# Patient Record
Sex: Female | Born: 1955 | Race: Black or African American | Hispanic: No | Marital: Single | Smoking: Former smoker
Health system: Southern US, Community
[De-identification: ages and names within clinical notes are randomized; demographics above are authoritative.]

## PROBLEM LIST (undated history)

## (undated) DIAGNOSIS — R600 Localized edema: Secondary | ICD-10-CM

## (undated) DIAGNOSIS — E039 Hypothyroidism, unspecified: Secondary | ICD-10-CM

## (undated) DIAGNOSIS — F319 Bipolar disorder, unspecified: Secondary | ICD-10-CM

## (undated) DIAGNOSIS — L0390 Cellulitis, unspecified: Secondary | ICD-10-CM

## (undated) DIAGNOSIS — R609 Edema, unspecified: Secondary | ICD-10-CM

## (undated) DIAGNOSIS — I10 Essential (primary) hypertension: Secondary | ICD-10-CM

## (undated) DIAGNOSIS — I606 Nontraumatic subarachnoid hemorrhage from other intracranial arteries: Secondary | ICD-10-CM

## (undated) DIAGNOSIS — E119 Type 2 diabetes mellitus without complications: Secondary | ICD-10-CM

## (undated) DIAGNOSIS — M8580 Other specified disorders of bone density and structure, unspecified site: Secondary | ICD-10-CM

## (undated) HISTORY — DX: Hypothyroidism, unspecified: E03.9

## (undated) HISTORY — DX: Localized edema: R60.0

## (undated) HISTORY — DX: Nontraumatic subarachnoid hemorrhage from other intracranial arteries: I60.6

## (undated) HISTORY — PX: FINGER SURGERY: SHX640

## (undated) HISTORY — DX: Type 2 diabetes mellitus without complications: E11.9

## (undated) HISTORY — DX: Essential (primary) hypertension: I10

## (undated) HISTORY — DX: Edema, unspecified: R60.9

## (undated) HISTORY — DX: Bipolar disorder, unspecified: F31.9

## (undated) HISTORY — DX: Other specified disorders of bone density and structure, unspecified site: M85.80

---

## 1998-12-25 HISTORY — PX: APPENDECTOMY: SHX54

## 1999-11-01 ENCOUNTER — Encounter (INDEPENDENT_AMBULATORY_CARE_PROVIDER_SITE_OTHER): Payer: Self-pay | Admitting: Specialist

## 1999-11-01 ENCOUNTER — Inpatient Hospital Stay (HOSPITAL_COMMUNITY): Admission: EM | Admit: 1999-11-01 | Discharge: 1999-11-05 | Payer: Self-pay | Admitting: Emergency Medicine

## 2001-07-03 ENCOUNTER — Encounter: Admission: RE | Admit: 2001-07-03 | Discharge: 2001-10-01 | Payer: Self-pay | Admitting: Family Medicine

## 2002-05-20 ENCOUNTER — Other Ambulatory Visit: Admission: RE | Admit: 2002-05-20 | Discharge: 2002-05-20 | Payer: Self-pay | Admitting: Family Medicine

## 2003-01-18 ENCOUNTER — Emergency Department (HOSPITAL_COMMUNITY): Admission: EM | Admit: 2003-01-18 | Discharge: 2003-01-18 | Payer: Self-pay | Admitting: Emergency Medicine

## 2003-01-18 ENCOUNTER — Encounter: Payer: Self-pay | Admitting: Emergency Medicine

## 2003-06-10 ENCOUNTER — Other Ambulatory Visit: Admission: RE | Admit: 2003-06-10 | Discharge: 2003-06-10 | Payer: Self-pay | Admitting: Family Medicine

## 2004-06-13 ENCOUNTER — Other Ambulatory Visit: Admission: RE | Admit: 2004-06-13 | Discharge: 2004-06-13 | Payer: Self-pay | Admitting: Family Medicine

## 2005-06-12 ENCOUNTER — Other Ambulatory Visit: Admission: RE | Admit: 2005-06-12 | Discharge: 2005-06-12 | Payer: Self-pay | Admitting: Family Medicine

## 2006-06-06 ENCOUNTER — Other Ambulatory Visit: Admission: RE | Admit: 2006-06-06 | Discharge: 2006-06-06 | Payer: Self-pay | Admitting: Family Medicine

## 2006-12-25 HISTORY — PX: BRAIN SURGERY: SHX531

## 2007-12-09 ENCOUNTER — Other Ambulatory Visit: Admission: RE | Admit: 2007-12-09 | Discharge: 2007-12-09 | Payer: Self-pay | Admitting: Family Medicine

## 2007-12-13 ENCOUNTER — Encounter: Payer: Self-pay | Admitting: Emergency Medicine

## 2007-12-14 ENCOUNTER — Ambulatory Visit: Payer: Self-pay | Admitting: Pulmonary Disease

## 2007-12-14 ENCOUNTER — Inpatient Hospital Stay (HOSPITAL_COMMUNITY): Admission: EM | Admit: 2007-12-14 | Discharge: 2008-01-03 | Payer: Self-pay | Admitting: Emergency Medicine

## 2007-12-27 ENCOUNTER — Ambulatory Visit: Payer: Self-pay | Admitting: Physical Medicine & Rehabilitation

## 2008-01-24 ENCOUNTER — Encounter: Admission: RE | Admit: 2008-01-24 | Discharge: 2008-04-23 | Payer: Self-pay | Admitting: Neurosurgery

## 2008-02-05 ENCOUNTER — Encounter: Admission: RE | Admit: 2008-02-05 | Discharge: 2008-02-05 | Payer: Self-pay | Admitting: Neurosurgery

## 2008-09-14 IMAGING — CT CT HEAD W/O CM
1 series · 15 of 30 positions shown, 19 images · IV contrast (agent unspecified)
Comparison: 12/24/07 and earlier.

CLINICAL DATA: 51-year-old female with history of PICA aneurysm rupture and craniotomy with aneurysm clipping.  
 HEAD CT WITHOUT CONTRAST:
TECHNIQUE: Contiguous axial images were obtained from the base of the skull through the vertex according to standard protocol without contrast.

[Series 2: brain · axial · 0.47mm/px · z∈[+109,+252]mm · 15 of 32 slices shown, 19 images]
[im 2/32  brain]
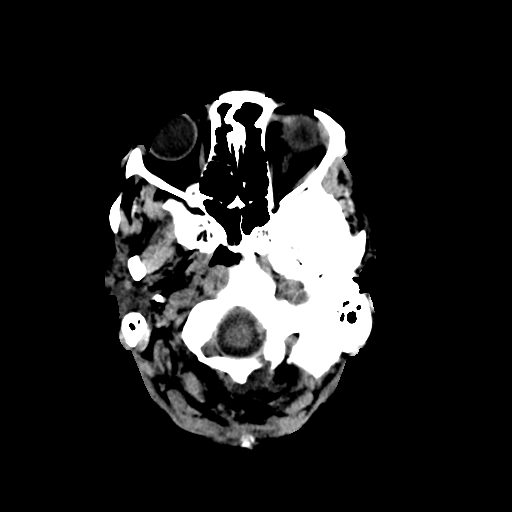
[im 2/32  bone]
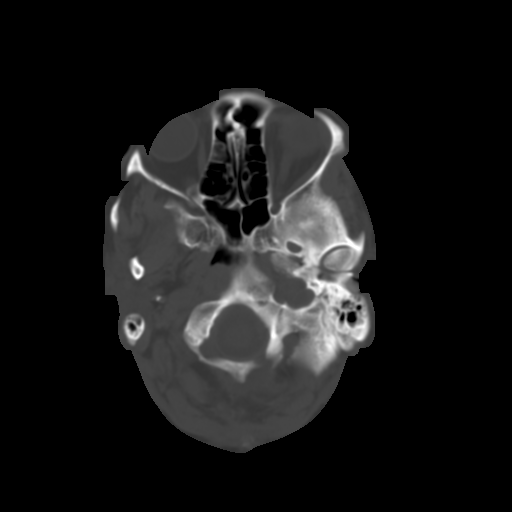
[im 4/32  brain]
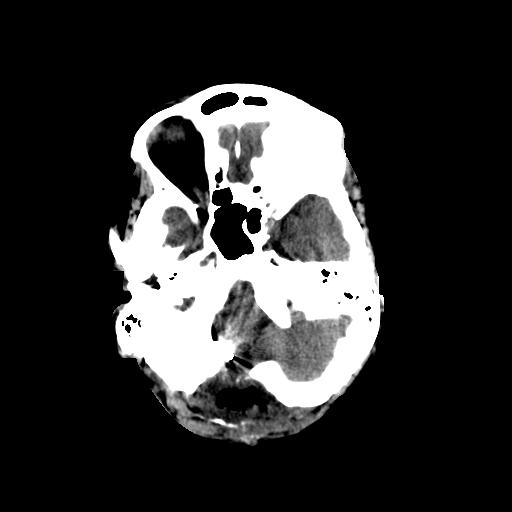
[im 6/32  brain]
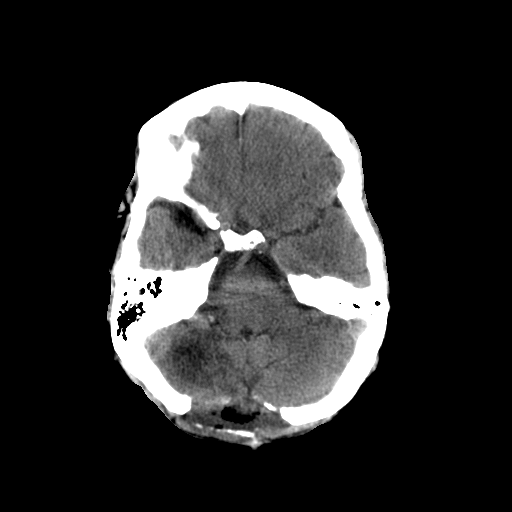
[im 8/32  brain]
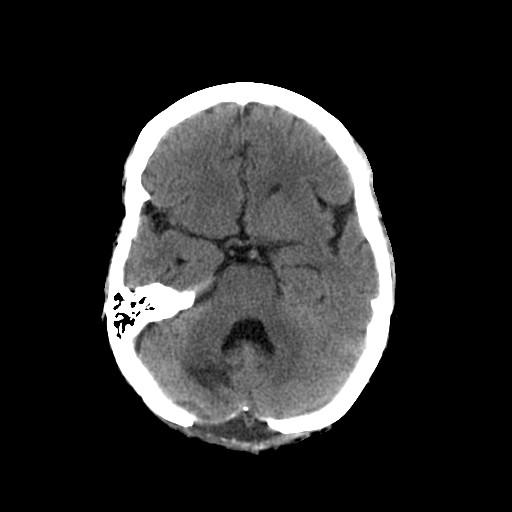
[im 10/32  brain]
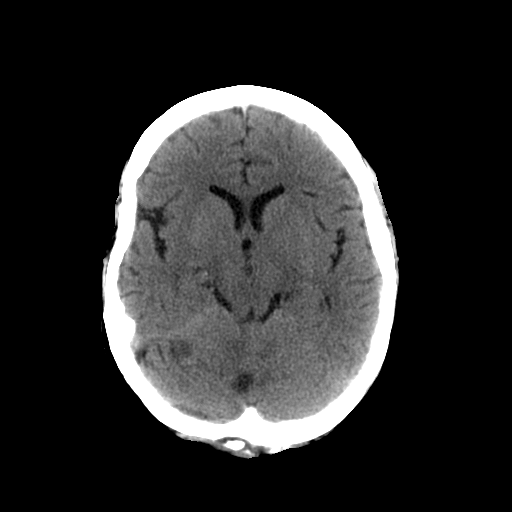
[im 10/32  bone]
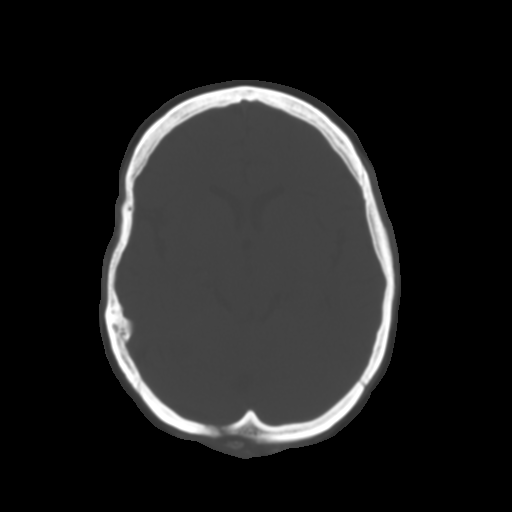
[im 12/32  brain]
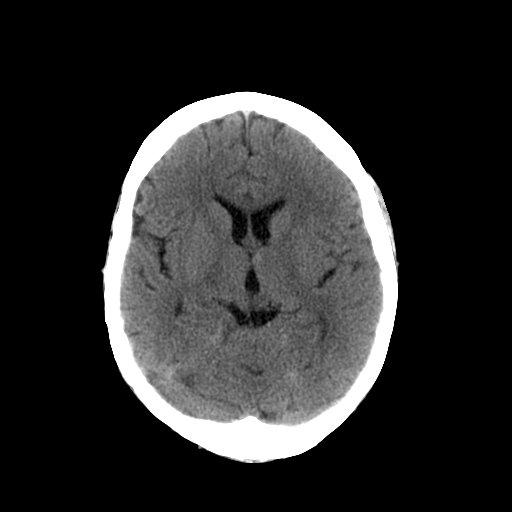
[im 14/32  brain]
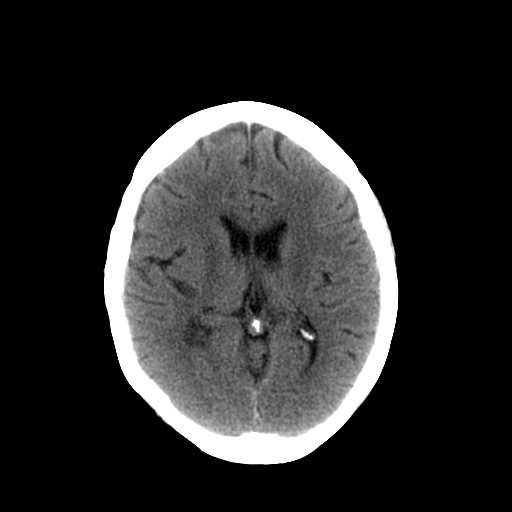
[im 17/32  brain]
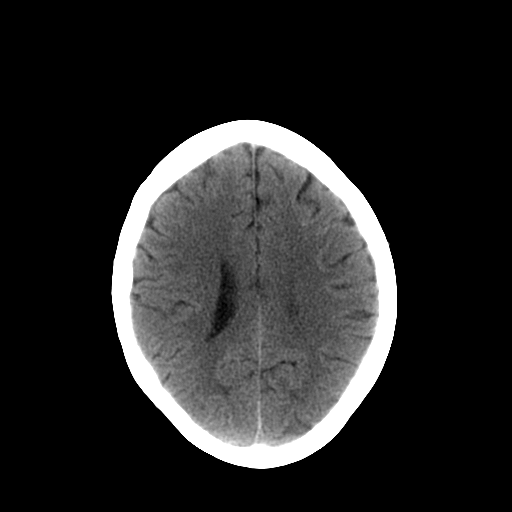
[im 18/32  brain]
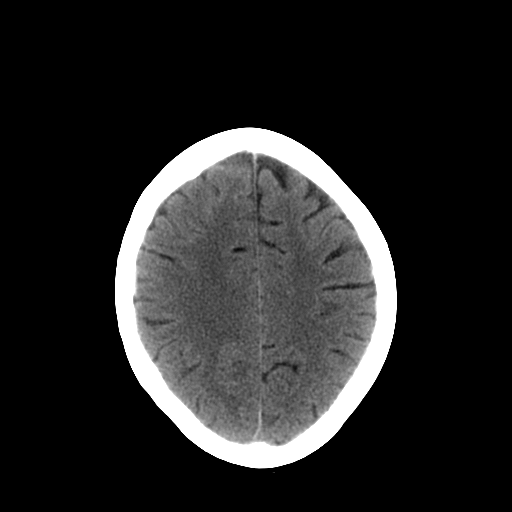
[im 18/32  bone]
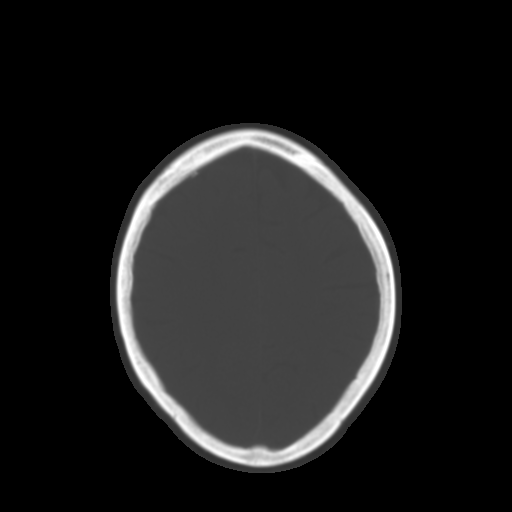
[im 20/32  brain]
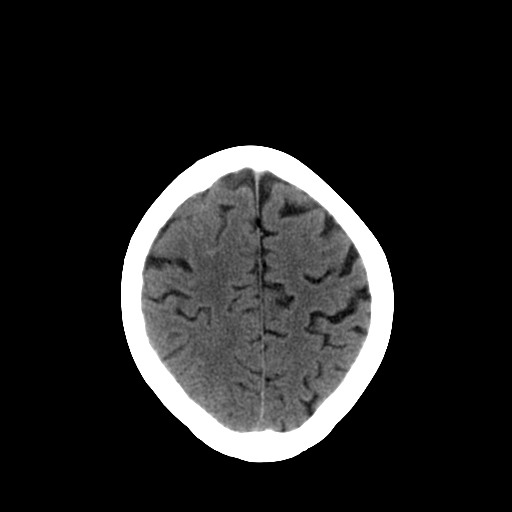
[im 22/32  brain]
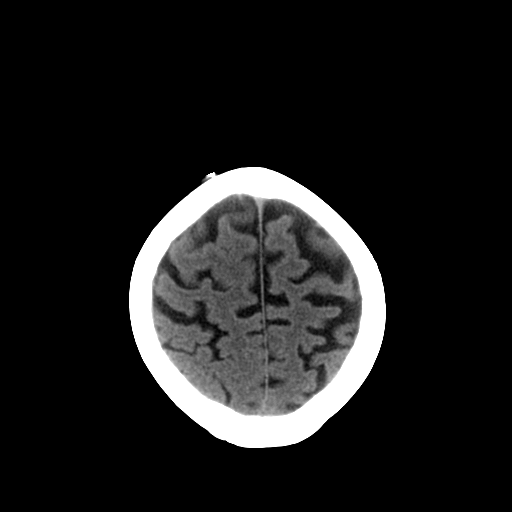
[im 24/32  brain]
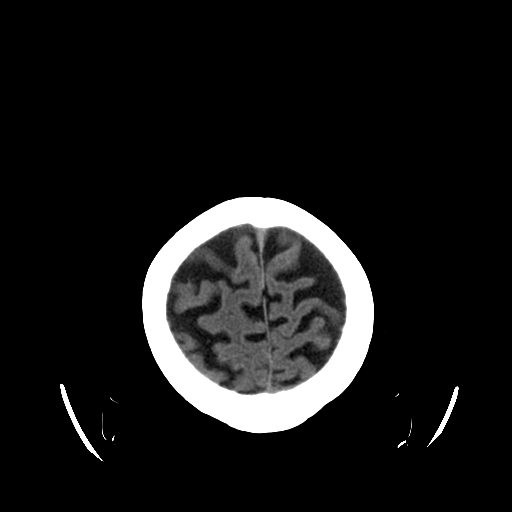
[im 26/32  brain]
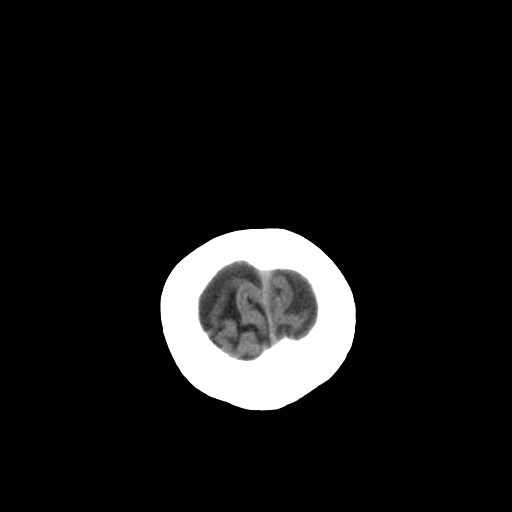
[im 26/32  bone]
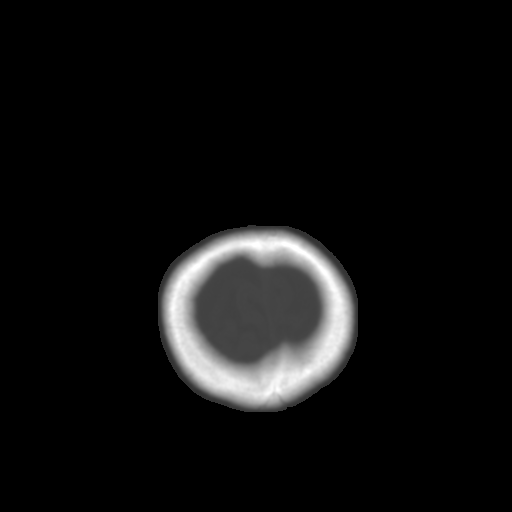
[im 28/32  brain]
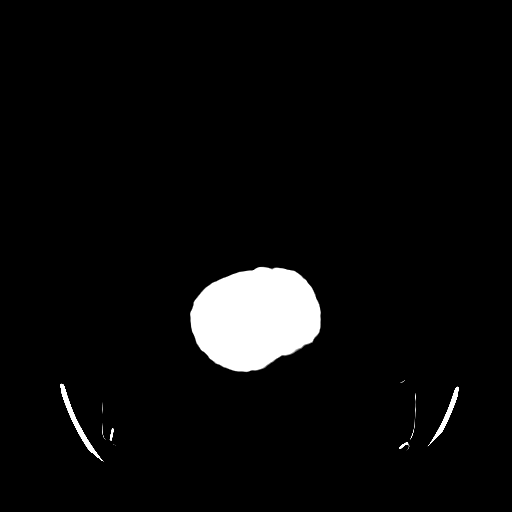
[im 30/32  brain]
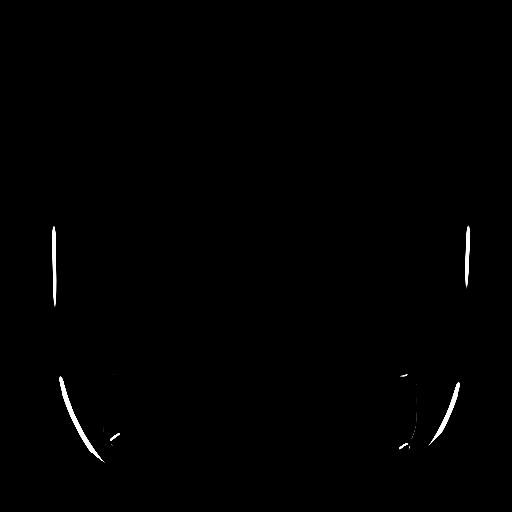

[15 of 30 positions shown; findings below may reference images not displayed]

FINDINGS: Sequelae of suboccipital craniectomy are noted with stable aneurysm clip in the posterior fossa, region of the right posteroinferior cerebellar artery.  Stable subcutaneous gas, which may be associated with dural packing.  Stable small extraaxial fluid collection at the craniectomy site.  Basilar cisterns remain patent.  Sequelae of right inferior cerebellar parenchymal hemorrhage now with hypodensity in the region compatible with developing encephalomalacia is unchanged.  Fourth ventricle configuration is normal.  No acute intracranial hemorrhage, midline shift or mass effect.  Stable ventricular size and configuration.  Right frontal ventriculostomy catheter has been removed.  Stable gray-white matter differentiation, no evidence of acute cortically based infarct.  No acute osseous abnormality.  Mild paranasal sinus mucosal thickening.  Visualized orbits are normal.
IMPRESSION: 1.  Stable sequelae of suboccipital craniectomy and right PICA aneurysm clipping.
 2.  Expected evolution of right inferior cerebellar hemorrhagic contusion.  
 3.  Interval removal of right frontal approach ventriculostomy catheter, stable ventricular size.

## 2009-01-04 ENCOUNTER — Encounter: Admission: RE | Admit: 2009-01-04 | Discharge: 2009-04-04 | Payer: Self-pay | Admitting: Family Medicine

## 2009-02-22 ENCOUNTER — Ambulatory Visit (HOSPITAL_COMMUNITY): Admission: RE | Admit: 2009-02-22 | Discharge: 2009-02-22 | Payer: Self-pay | Admitting: Family Medicine

## 2009-02-23 ENCOUNTER — Ambulatory Visit (HOSPITAL_COMMUNITY): Admission: RE | Admit: 2009-02-23 | Discharge: 2009-02-23 | Payer: Self-pay | Admitting: Family Medicine

## 2009-06-21 ENCOUNTER — Encounter: Admission: RE | Admit: 2009-06-21 | Discharge: 2009-06-21 | Payer: Self-pay | Admitting: Family Medicine

## 2009-07-02 ENCOUNTER — Encounter: Admission: RE | Admit: 2009-07-02 | Discharge: 2009-07-02 | Payer: Self-pay | Admitting: Family Medicine

## 2010-03-16 IMAGING — CT CT CHEST W/O CM
1 series · 6 of 8 positions shown, 8 images · non-contrast
Comparison: CT of the chest of 06/21/2009

CLINICAL DATA: Shortness of breath,

CT CHEST WITHOUT CONTRAST
TECHNIQUE: Multidetector CT imaging of the chest was performed
following the standard protocol without IV contrast.

[Series 2: high res inspiration · axial · 0.70mm/px · z∈[-221,-71]mm · 6 of 8 slices shown, 8 images]
[im 2/8  mediastinal]
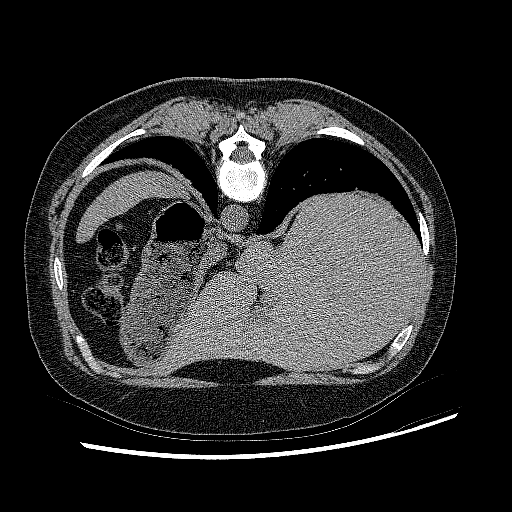
[im 2/8  lung]
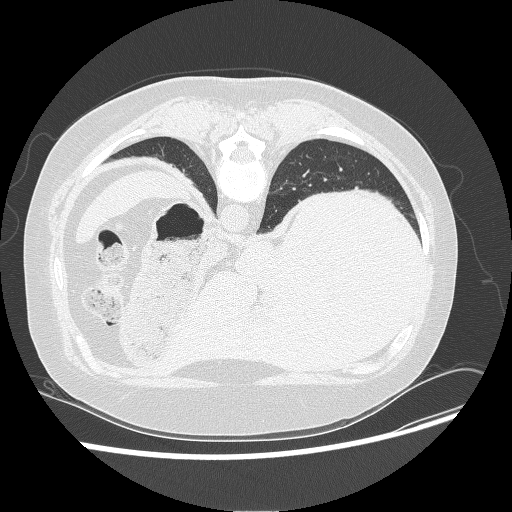
[im 3/8  lung]
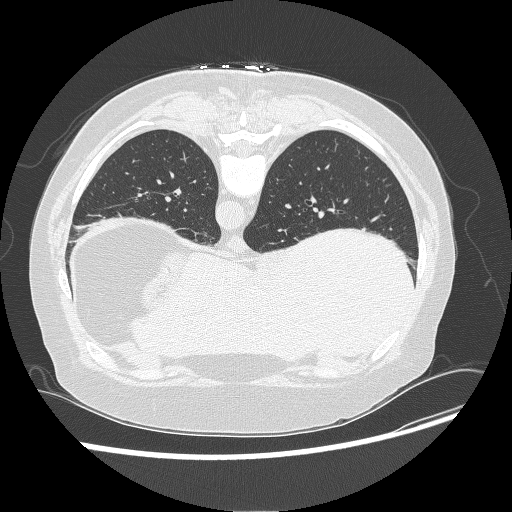
[im 4/8  lung]
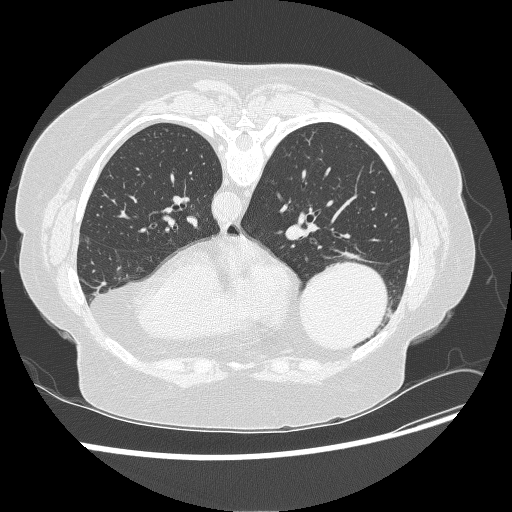
[im 5/8  lung]
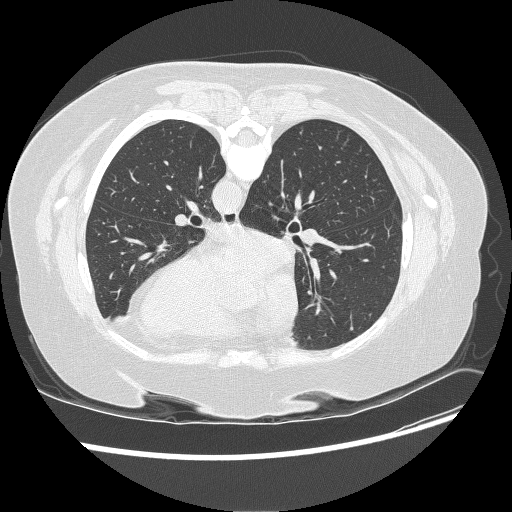
[im 6/8  mediastinal]
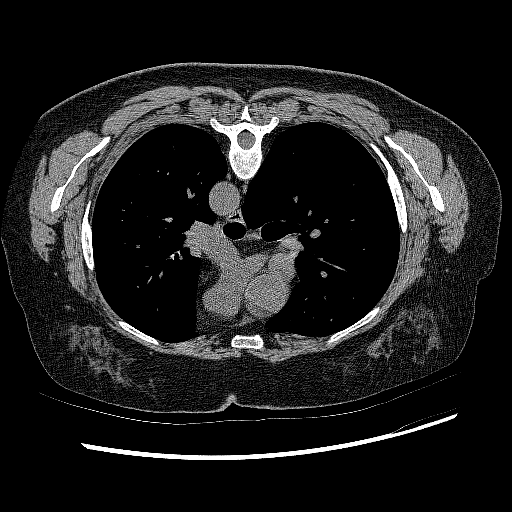
[im 6/8  lung]
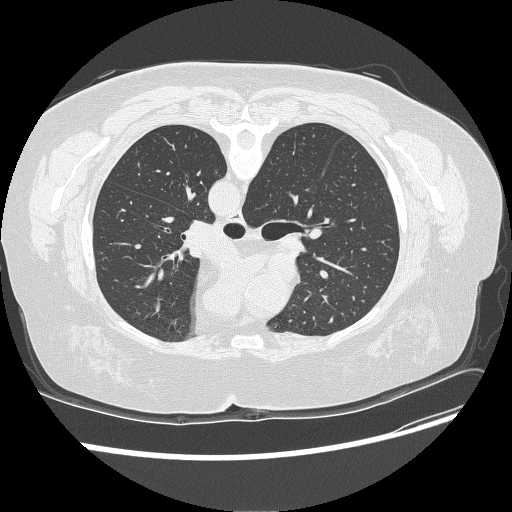
[im 7/8  lung]
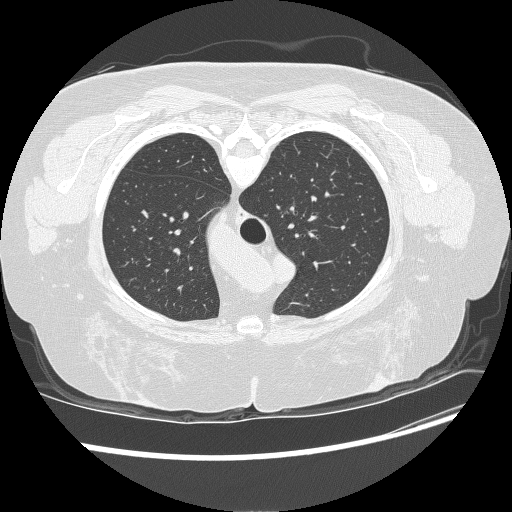

[6 of 8 positions shown; findings below may reference images not displayed]

FINDINGS: Only high resolution images were obtained in the prone
position.

Subpleural linear opacities noted on the prior CT of the chest do
appear to have represented dependent atelectasis.  No persistent
subpleural linear opacities are noted within the posterior lung
bases.  There is now mild linear atelectasis more anteriorly
positioned, again dependent in position.
IMPRESSION: The previously noted subpleural linear opacities at the lung bases
on recent CT of the chest have cleared, having represented
dependent linear atelectasis.

## 2011-01-15 ENCOUNTER — Encounter: Payer: Self-pay | Admitting: Family Medicine

## 2011-01-15 ENCOUNTER — Encounter: Payer: Self-pay | Admitting: Neurosurgery

## 2011-02-02 ENCOUNTER — Other Ambulatory Visit (HOSPITAL_COMMUNITY)
Admission: RE | Admit: 2011-02-02 | Discharge: 2011-02-02 | Disposition: A | Discharge status: Home / Self Care | Payer: Managed Care, Other (non HMO) | Source: Ambulatory Visit | Attending: Family Medicine | Admitting: Family Medicine

## 2011-02-02 ENCOUNTER — Other Ambulatory Visit: Payer: Self-pay | Admitting: Family Medicine

## 2011-02-02 DIAGNOSIS — Z124 Encounter for screening for malignant neoplasm of cervix: Secondary | ICD-10-CM | POA: Insufficient documentation

## 2011-05-09 NOTE — Op Note (Signed)
[name], Julia Huff               ACCOUNT NO.:  [id number]   MEDICAL RECORD NO.:  [id number]          PATIENT TYPE:  INP   LOCATION:  3105                         FACILITY:  [facility]   PHYSICIAN:  Hilda Lias, M.D.   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   PROCEDURE:  Introduction of right intraventricular catheter for drainage  and ICP monitor.   SURGEON:  Botero   CLINICAL HISTORY:  The patient was admitted because of headache.  X-rays  showed that she has a small aneurysm in the posterior fossa.  Coiling  was to be done today by Dr. Corliss Skains from the x-ray department but  suddenly the patient developed headache.  She became unconscious.  She  was intubated and CT scan showed intracerebellar hematoma with early  hydrocephalus.  An intraventricular catheter was advised as a first  stage for surgery.   PROCEDURE:  The right side of the head was shaved and prepped with  DuraPrep.  Midline incision away from the midline was done through the  scalp down to the frontal bone.  Then the bone was drilled.  The dura  mater was opened.  The ventricular catheter was inserted.  Immediately  CSF came back with a high pressure.  The catheter was secured in place.  She is going to go eventually to surgery in the next 2-3 hours for the  evacuation of the hematoma and clipping of the aneurysm.           ______________________________  Hilda Lias, M.D.     EB/MEDQ  D:  12/14/2007  T:  12/16/2007  Job:  [id number]

## 2011-05-09 NOTE — Discharge Summary (Signed)
Julia Huff, Julia Huff               ACCOUNT NO.:  [id number]   MEDICAL RECORD NO.:  [id number]          PATIENT TYPE:  INP   LOCATION:  3022                         FACILITY:  [facility]   PHYSICIAN:  Hilda Lias, M.D.   DATE OF BIRTH:  October 25, 1956   DATE OF ADMISSION:  12/13/2007  DATE OF DISCHARGE:  01/03/2008                               DISCHARGE SUMMARY   ADMISSION DIAGNOSES:  1. Fracture of PICA aneurysm.  2. Cerebral hematoma.  3. Hydrocephalus.   FINAL DIAGNOSES:  1. Fracture of  PICA aneurysm.  2. Cerebral hematoma.  3. Hydrocephalus.   CLINICAL HISTORY:  [name] is a lady who was admitted through [facility] after she developed a headache.  X-ray was positive for  subarachnoid hemorrhage.  The patient immediately as emergency was  transferred to the hospital where she was admitted.   LABORATORY:  Normal.   HOSPITAL COURSE:  The patient was admitted through the emergency room.  She had an emergency angiogram which showed that she had a subarachnoid  hemorrhage secondary to rupture of a PICA aneurysm.  Nevertheless, the  radiologist decided to wait till the morning to do the coiling.  Three  hours later, she went into a coma.  X-rays showed that she had a re-  bleed with a large cerebellar hematoma hydrocephalus.  Intraventricular  catheter was inserted and she was taken immediately to surgery where a  posterior craniectomy with clipping of the PICA aneurysm was done.  The  patient was kept intubated and she was kept on medication to prevent  vasospasm.  Nevertheless, the patient did well.  She was extubated.  The  patient at the present time is awake, following commands.  She had a  minimal headache.  Her wounds are well healed.  A CT scan done 2 days  ago was normal.  Today, the patient completed 21 days with __________  .  The patient has plenty of help at home and she is going to be discharged  to be followed by me in my office.   CONDITION ON  DISCHARGE:  Improving.   MEDICATIONS:  Tylenol for pain.  She will continue her previous  medications.  She is not to take any aspirin or any antiinflammatories.   DIET:  Regular.   ACTIVITY:  She is not to drive.  She can ride.   FOLLOWUP:  She is going to be seen by me in my office in 4 weeks.  She  also was advised to call Dr. Abigail Miyamoto as well as her psychiatrist.           ______________________________  Hilda Lias, M.D.     EB/MEDQ  D:  01/03/2008  T:  01/03/2008  Job:  [id number]   cc:   Chales Salmon. Abigail Miyamoto, M.D.

## 2011-05-09 NOTE — Op Note (Signed)
Julia Huff, Julia Huff               ACCOUNT NO.:  [id number]   MEDICAL RECORD NO.:  [id number]          PATIENT TYPE:  INP   LOCATION:  3105                         FACILITY:  [facility]   PHYSICIAN:  Hilda Lias, M.D.   DATE OF BIRTH:  02-23-1956   DATE OF PROCEDURE:  12/14/2007  DATE OF DISCHARGE:                               OPERATIVE REPORT   PREOPERATIVE DIAGNOSES:  Right cerebral hematoma, rupture of posterior  inferior cerebellar artery aneurysm.   POSTOPERATIVE DIAGNOSES:  Right cerebral hematoma, rupture of posterior  inferior cerebellar artery aneurysm.   PROCEDURE:  Bilateral occipital craniectomy.  Question of right  cerebellar hematoma.  Clipping of the PICA aneurysm.  Microscopy.   SURGEON:  Hilda Lias, M.D.   ASSISTANT:  Hewitt Shorts, M.D.   CLINICAL HISTORY:  Julia Huff was admitted through the emergency room  yesterday after she complained of headache.  An angiogram done,  finishing about 2:00 this morning, showed that she has a PICA aneurysm.  Because of the location and because the patient was stable, it was  decided by the interventional radiologist to proceed with coiling in the  morning.  Nevertheless, about 4:00 in the morning, the patient became  unconscious.  Immediately, the patient was intubated, and CT scan of the  head was done.  It showed that she has a large hematoma in the right  cerebellum.  Immediately, intraventricular catheter was inserted in the  right frontal area, and we called for emergency craniotomy.  The family  was fully informed about the risks, including the possibility of death,  no improvement, paralysis, need for surgery, and rebleeding.   PROCEDURE:  The patient was taken to the O.R., and pins were applied to  the head, and she was positioned in a prone manner.  The posterior part  of the head was shaved and prepped with DuraPrep.  Drapes were applied.  Midline incision was made from the inion down to C3-C4.  Incision  was  carried out all the way laterally.  Retraction was done of the muscle  and fascia until we were able to see C1-C2, as well as the occipital  area.  With the drill, we drilled several bones.  With the [place], we  did cut bilateral craniectomy, removing the ring of the foramen magnum.  Then, the dural matter was opened in a cruciate incision.  In the right  side, we found some subdural hematoma, which was evacuated.  Incision  was made in the cerebellum in the lower part, and immediately we went  into the cavity, with removal of hematoma of approximately 8 x 10 cm.  Then, we started our dissection, and we found the vertebral artery.  Dissection was carried out.  We found the takeoff of PICA.  We followed  PICA until indeed we found the aneurysm, which was about 5 mm long.  There was adhesion with some blood right at the level of the dome.  Dissection was carried down, and after we had a good neck, a straight  clip was inserted around the neck.  There was good flow of  the artery  after the clip.  From then on, the area was irrigated.  Avitene was used  for  hemostasis.  The dural mater was closed with artificial dural mater.  This was kept in place using 4-0 silk.  Then, Tisseel was used for  further sealing of the area.  The wound was closed with Vicryl and  staples.  The patient is going to go to the intensive care unit for  further care.           ______________________________  Hilda Lias, M.D.     EB/MEDQ  D:  12/14/2007  T:  12/16/2007  Job:  [id number]

## 2011-05-09 NOTE — H&P (Signed)
Julia Huff, Julia Huff               ACCOUNT NO.:  [id number]   MEDICAL RECORD NO.:  [id number]          PATIENT TYPE:  INP   LOCATION:  3105                         FACILITY:  [facility]   PHYSICIAN:  Hilda Lias, M.D.   DATE OF BIRTH:  Apr 26, 1956   DATE OF ADMISSION:  12/13/2007  DATE OF DISCHARGE:                              HISTORY & PHYSICAL   Julia Huff is a lady, who went to the emergency room complaining of a  headache.  According to her, the patient complained of a headache, which  was the worst one in her life.  The patient presented about two hours  ago.  By the time she came to the emergency room, she felt that the  headache was getting a bit better.  This headache was mostly intense and  going to the right side of the head.  She was a little bit sweaty and  nauseated.  The patient had a CT scan of the head, which was essentially  negative.  A spinal tap done by Radiology was positive for blood in the  CSF.  Because of the findings, she was transferred to [facility] where an emergency angiogram was done, and because of the  findings she is being admitted to the Neurosurgical floor.   PAST MEDICAL HISTORY:  1. History of surgery on her mouth.  2. Bipolar disorder.   FAMILY HISTORY:  Positive for diabetes, coronary artery disease.   SOCIAL HISTORY:  The patient does not smoke, does not drink.   PHYSICAL EXAMINATION:  GENERAL:  The patient right now is comfortable  with medications for sedation.  She is able to follow commands.  HEAD, EYES, EARS, NOSE AND THROAT:  Normal.  NECK:  She has some mild stiffness.  LUNGS:  Clear.  HEART:  Heart sounds are normal.  There were normal __________ and  normal pulses.  NEURO:  She is oriented x3, cranial nerves are normal, she has no  weakness __________ .   CLINICAL IMPRESSION:  Severe onset of headache with a lumbar puncture  positive for a cerebral hemorrhage.  The cerebral angiogram done by the  interventional  radiologist showed that at the pica artery she has a 4 to  5-mm aneurysm.   RECOMMENDATIONS:  The patient is going to be admitted to the  neurosurgical floor.  She is going to be kept NPO and tomorrow they are  going to try to coil the aneurysm.  I talked to the sisters, who were  outside the floor.  I told them that the interventional radiologist will  be talking to all of them prior to the procedure and he will be able to  fully explain the risk with the procedure itself.  There is a  possibility that if he is unable to occlude the aneurysm then she is  going to require surgery.          ______________________________  Hilda Lias, M.D.    EB/MEDQ  D:  12/14/2007  T:  12/15/2007  Job:  [id number]

## 2011-09-14 LAB — BASIC METABOLIC PANEL
CO2: 26
Calcium: 8.6
GFR calc Af Amer: >60
GFR calc non Af Amer: >60
Glucose, Bld: 100 — ABNORMAL HIGH

## 2011-09-29 LAB — CROSSMATCH

## 2011-09-29 LAB — URINALYSIS, ROUTINE W REFLEX MICROSCOPIC
Ketones, ur: NEGATIVE
Ketones, ur: 40 — AB
Leukocytes, UA: NEGATIVE
Leukocytes, UA: NEGATIVE
Nitrite: NEGATIVE
Protein, ur: NEGATIVE
Specific Gravity, Urine: 1.021
Urobilinogen, UA: 0.2
Urobilinogen, UA: 0.2
Urobilinogen, UA: 0.2
pH: 5.5

## 2011-09-29 LAB — BASIC METABOLIC PANEL
BUN: 13
BUN: 8
BUN: 9
CO2: 24
CO2: 27
Calcium: 8.2 — ABNORMAL LOW
Calcium: 8.5
Chloride: 100
Chloride: 106
Chloride: 107
Creatinine, Ser: 0.57
Creatinine, Ser: 0.58
Creatinine, Ser: 0.64
Creatinine, Ser: 0.72
GFR calc non Af Amer: >60
Glucose, Bld: 138 — ABNORMAL HIGH
Glucose, Bld: 143 — ABNORMAL HIGH
Glucose, Bld: 96
Glucose, Bld: 97
Glucose, Bld: 98
Potassium: 3.5
Potassium: 4.3
Potassium: 4.4
Sodium: 129 — ABNORMAL LOW
Sodium: 135
Sodium: 139

## 2011-09-29 LAB — GRAM STAIN: Gram Stain: NONE SEEN

## 2011-09-29 LAB — CBC
HCT: 28.4 — ABNORMAL LOW
HCT: 28.5 — ABNORMAL LOW
HCT: 31.6 — ABNORMAL LOW
Hemoglobin: 14.1
Hemoglobin: 10.1 — ABNORMAL LOW
Hemoglobin: 10.8 — ABNORMAL LOW
Hemoglobin: 12.3
Hemoglobin: 9.4 — ABNORMAL LOW
Hemoglobin: 9.8 — ABNORMAL LOW
MCHC: 33.1
MCHC: 33.8
MCHC: 33.8
MCHC: 34.2
MCHC: 34.7
MCV: 94.5
MCV: 95.2
MCV: 95.6
MCV: 95.8
MCV: 96.2
Platelets: 187
Platelets: 138 — ABNORMAL LOW
Platelets: 152
Platelets: 230
Platelets: 300
Platelets: 522 — ABNORMAL HIGH
RBC: 2.97 — ABNORMAL LOW
RBC: 3.31 — ABNORMAL LOW
RBC: 3.81 — ABNORMAL LOW
RDW: 13.5
RDW: 13.3
RDW: 13.5
RDW: 13.5
RDW: 13.6
RDW: 13.7
RDW: 13.7
RDW: 14
WBC: 11.8 — ABNORMAL HIGH
WBC: 14.2 — ABNORMAL HIGH
WBC: 9.5
WBC: 9.7

## 2011-09-29 LAB — BLOOD GAS, ARTERIAL
Acid-base deficit: 4.5 — ABNORMAL HIGH
Bicarbonate: 22.9
Drawn by: 290241
Mode: POSITIVE
O2 Saturation: 99.3
RATE: 18
TCO2: 19.7
TCO2: 24.1
pCO2 arterial: 27.6 — ABNORMAL LOW
pCO2 arterial: 40.3
pH, Arterial: 7.376
pO2, Arterial: 118 — ABNORMAL HIGH
pO2, Arterial: 130 — ABNORMAL HIGH
pO2, Arterial: 166 — ABNORMAL HIGH

## 2011-09-29 LAB — CULTURE, RESPIRATORY W GRAM STAIN: Culture: NORMAL

## 2011-09-29 LAB — URINE CULTURE
Colony Count: NO GROWTH
Colony Count: NO GROWTH
Culture: NO GROWTH
Culture: NO GROWTH

## 2011-09-29 LAB — URINE MICROSCOPIC-ADD ON

## 2011-09-29 LAB — POCT I-STAT 7, (LYTES, BLD GAS, ICA,H+H)
Acid-base deficit: 7 — ABNORMAL HIGH
Calcium, Ion: 1 — ABNORMAL LOW
HCT: 33 — ABNORMAL LOW
O2 Saturation: 100
Operator id: 190201
Potassium: 2.6 — CL
TCO2: 15
pCO2 arterial: 20 — ABNORMAL LOW
pCO2 arterial: 27.4 — ABNORMAL LOW
pO2, Arterial: 401 — ABNORMAL HIGH
pO2, Arterial: 523 — ABNORMAL HIGH

## 2011-09-29 LAB — DIFFERENTIAL
Basophils Absolute: 0
Basophils Relative: 0
Basophils Relative: 0
Eosinophils Absolute: 0.1 — ABNORMAL LOW
Eosinophils Absolute: 0
Eosinophils Relative: 1
Eosinophils Relative: 0
Lymphocytes Relative: 32
Lymphocytes Relative: 14
Lymphs Abs: 3.7
Lymphs Abs: 1.3
Monocytes Absolute: 1.4 — ABNORMAL HIGH
Monocytes Relative: 4
Monocytes Relative: 15 — ABNORMAL HIGH
Neutro Abs: 6.8
Neutrophils Relative %: 71

## 2011-09-29 LAB — CSF CELL COUNT WITH DIFFERENTIAL
Eosinophils, CSF: 4 — ABNORMAL HIGH
Lymphs, CSF: 19 — ABNORMAL LOW
Monocyte-Macrophage-Spinal Fluid: 8 — ABNORMAL LOW
RBC Count, CSF: 43500 — ABNORMAL HIGH
Tube #: 4

## 2011-09-29 LAB — CULTURE, BLOOD (ROUTINE X 2)
Culture: NO GROWTH
Culture: NO GROWTH

## 2011-09-29 LAB — ELECTROLYTE PANEL
CO2: 18 — ABNORMAL LOW
Chloride: 106
Chloride: 108
Chloride: 114 — ABNORMAL HIGH
Potassium: 3.2 — ABNORMAL LOW
Potassium: 3.3 — ABNORMAL LOW
Sodium: 135
Sodium: 140

## 2011-09-29 LAB — COMPREHENSIVE METABOLIC PANEL
ALT: 9
Alkaline Phosphatase: 61
Calcium: 9.6
Creatinine, Ser: 0.78
GFR calc Af Amer: >60
Potassium: 3 — ABNORMAL LOW

## 2011-09-29 LAB — BODY FLUID CULTURE

## 2011-09-29 LAB — EXPECTORATED SPUTUM ASSESSMENT W GRAM STAIN, RFLX TO RESP C

## 2011-09-29 LAB — POCT PREGNANCY, URINE: Operator id: 264421

## 2011-09-29 LAB — TRIGLYCERIDES: Triglycerides: 39

## 2011-09-29 LAB — PROTIME-INR: INR: 1

## 2011-09-29 LAB — CSF CULTURE W GRAM STAIN

## 2011-09-29 LAB — CHOLESTEROL, TOTAL: Cholesterol: 165

## 2011-09-29 LAB — PHOSPHORUS: Phosphorus: 1.9 — ABNORMAL LOW

## 2012-04-03 ENCOUNTER — Other Ambulatory Visit (HOSPITAL_COMMUNITY)
Admission: RE | Admit: 2012-04-03 | Discharge: 2012-04-03 | Disposition: A | Discharge status: Home / Self Care | Payer: Medicare Other | Source: Ambulatory Visit | Attending: Family Medicine | Admitting: Family Medicine

## 2012-04-03 ENCOUNTER — Other Ambulatory Visit: Payer: Self-pay | Admitting: Family Medicine

## 2012-04-03 DIAGNOSIS — I679 Cerebrovascular disease, unspecified: Secondary | ICD-10-CM | POA: Diagnosis not present

## 2012-04-03 DIAGNOSIS — Z0000 Encounter for general adult medical examination without abnormal findings: Secondary | ICD-10-CM | POA: Diagnosis not present

## 2012-04-03 DIAGNOSIS — R7301 Impaired fasting glucose: Secondary | ICD-10-CM | POA: Diagnosis not present

## 2012-04-03 DIAGNOSIS — E039 Hypothyroidism, unspecified: Secondary | ICD-10-CM | POA: Diagnosis not present

## 2012-04-03 DIAGNOSIS — Z1159 Encounter for screening for other viral diseases: Secondary | ICD-10-CM | POA: Insufficient documentation

## 2012-04-03 DIAGNOSIS — Z79899 Other long term (current) drug therapy: Secondary | ICD-10-CM | POA: Diagnosis not present

## 2012-04-03 DIAGNOSIS — I10 Essential (primary) hypertension: Secondary | ICD-10-CM | POA: Diagnosis not present

## 2012-04-03 DIAGNOSIS — Z124 Encounter for screening for malignant neoplasm of cervix: Secondary | ICD-10-CM | POA: Insufficient documentation

## 2012-04-03 DIAGNOSIS — F39 Unspecified mood [affective] disorder: Secondary | ICD-10-CM | POA: Diagnosis not present

## 2012-04-03 DIAGNOSIS — M899 Disorder of bone, unspecified: Secondary | ICD-10-CM | POA: Diagnosis not present

## 2012-05-30 DIAGNOSIS — F259 Schizoaffective disorder, unspecified: Secondary | ICD-10-CM | POA: Diagnosis not present

## 2012-06-13 DIAGNOSIS — M949 Disorder of cartilage, unspecified: Secondary | ICD-10-CM | POA: Diagnosis not present

## 2012-06-13 DIAGNOSIS — Z1231 Encounter for screening mammogram for malignant neoplasm of breast: Secondary | ICD-10-CM | POA: Diagnosis not present

## 2012-08-29 DIAGNOSIS — F259 Schizoaffective disorder, unspecified: Secondary | ICD-10-CM | POA: Diagnosis not present

## 2012-12-26 DIAGNOSIS — F259 Schizoaffective disorder, unspecified: Secondary | ICD-10-CM | POA: Diagnosis not present

## 2013-03-17 DIAGNOSIS — F259 Schizoaffective disorder, unspecified: Secondary | ICD-10-CM | POA: Diagnosis not present

## 2013-03-20 DIAGNOSIS — F259 Schizoaffective disorder, unspecified: Secondary | ICD-10-CM | POA: Diagnosis not present

## 2013-04-10 DIAGNOSIS — F259 Schizoaffective disorder, unspecified: Secondary | ICD-10-CM | POA: Diagnosis not present

## 2013-04-23 DIAGNOSIS — J029 Acute pharyngitis, unspecified: Secondary | ICD-10-CM | POA: Diagnosis not present

## 2013-04-28 DIAGNOSIS — B372 Candidiasis of skin and nail: Secondary | ICD-10-CM | POA: Diagnosis not present

## 2013-04-28 DIAGNOSIS — M899 Disorder of bone, unspecified: Secondary | ICD-10-CM | POA: Diagnosis not present

## 2013-04-28 DIAGNOSIS — Z1211 Encounter for screening for malignant neoplasm of colon: Secondary | ICD-10-CM | POA: Diagnosis not present

## 2013-04-28 DIAGNOSIS — F39 Unspecified mood [affective] disorder: Secondary | ICD-10-CM | POA: Diagnosis not present

## 2013-04-28 DIAGNOSIS — E039 Hypothyroidism, unspecified: Secondary | ICD-10-CM | POA: Diagnosis not present

## 2013-04-28 DIAGNOSIS — I10 Essential (primary) hypertension: Secondary | ICD-10-CM | POA: Diagnosis not present

## 2013-04-28 DIAGNOSIS — I679 Cerebrovascular disease, unspecified: Secondary | ICD-10-CM | POA: Diagnosis not present

## 2013-04-28 DIAGNOSIS — Z0000 Encounter for general adult medical examination without abnormal findings: Secondary | ICD-10-CM | POA: Diagnosis not present

## 2013-05-05 DIAGNOSIS — Z0000 Encounter for general adult medical examination without abnormal findings: Secondary | ICD-10-CM | POA: Diagnosis not present

## 2013-05-05 DIAGNOSIS — B372 Candidiasis of skin and nail: Secondary | ICD-10-CM | POA: Diagnosis not present

## 2013-05-05 DIAGNOSIS — Z1211 Encounter for screening for malignant neoplasm of colon: Secondary | ICD-10-CM | POA: Diagnosis not present

## 2013-05-05 DIAGNOSIS — E039 Hypothyroidism, unspecified: Secondary | ICD-10-CM | POA: Diagnosis not present

## 2013-05-05 DIAGNOSIS — M899 Disorder of bone, unspecified: Secondary | ICD-10-CM | POA: Diagnosis not present

## 2013-05-05 DIAGNOSIS — I679 Cerebrovascular disease, unspecified: Secondary | ICD-10-CM | POA: Diagnosis not present

## 2013-05-05 DIAGNOSIS — M949 Disorder of cartilage, unspecified: Secondary | ICD-10-CM | POA: Diagnosis not present

## 2013-05-05 DIAGNOSIS — F39 Unspecified mood [affective] disorder: Secondary | ICD-10-CM | POA: Diagnosis not present

## 2013-05-05 DIAGNOSIS — I10 Essential (primary) hypertension: Secondary | ICD-10-CM | POA: Diagnosis not present

## 2013-05-06 DIAGNOSIS — G479 Sleep disorder, unspecified: Secondary | ICD-10-CM | POA: Diagnosis not present

## 2013-05-15 DIAGNOSIS — F259 Schizoaffective disorder, unspecified: Secondary | ICD-10-CM | POA: Diagnosis not present

## 2013-06-16 DIAGNOSIS — Z803 Family history of malignant neoplasm of breast: Secondary | ICD-10-CM | POA: Diagnosis not present

## 2013-06-16 DIAGNOSIS — Z1231 Encounter for screening mammogram for malignant neoplasm of breast: Secondary | ICD-10-CM | POA: Diagnosis not present

## 2013-08-14 DIAGNOSIS — F259 Schizoaffective disorder, unspecified: Secondary | ICD-10-CM | POA: Diagnosis not present

## 2013-11-13 DIAGNOSIS — F259 Schizoaffective disorder, unspecified: Secondary | ICD-10-CM | POA: Diagnosis not present

## 2014-01-08 DIAGNOSIS — F259 Schizoaffective disorder, unspecified: Secondary | ICD-10-CM | POA: Diagnosis not present

## 2014-01-15 DIAGNOSIS — F259 Schizoaffective disorder, unspecified: Secondary | ICD-10-CM | POA: Diagnosis not present

## 2014-01-22 DIAGNOSIS — F259 Schizoaffective disorder, unspecified: Secondary | ICD-10-CM | POA: Diagnosis not present

## 2014-01-28 DIAGNOSIS — I10 Essential (primary) hypertension: Secondary | ICD-10-CM | POA: Diagnosis not present

## 2014-01-28 DIAGNOSIS — R609 Edema, unspecified: Secondary | ICD-10-CM | POA: Diagnosis not present

## 2014-02-12 DIAGNOSIS — F259 Schizoaffective disorder, unspecified: Secondary | ICD-10-CM | POA: Diagnosis not present

## 2014-02-12 DIAGNOSIS — D649 Anemia, unspecified: Secondary | ICD-10-CM | POA: Diagnosis not present

## 2014-02-12 DIAGNOSIS — R609 Edema, unspecified: Secondary | ICD-10-CM | POA: Diagnosis not present

## 2014-02-12 DIAGNOSIS — Z79899 Other long term (current) drug therapy: Secondary | ICD-10-CM | POA: Diagnosis not present

## 2014-02-12 DIAGNOSIS — E039 Hypothyroidism, unspecified: Secondary | ICD-10-CM | POA: Diagnosis not present

## 2014-03-20 DIAGNOSIS — D649 Anemia, unspecified: Secondary | ICD-10-CM | POA: Diagnosis not present

## 2014-04-09 DIAGNOSIS — F259 Schizoaffective disorder, unspecified: Secondary | ICD-10-CM | POA: Diagnosis not present

## 2014-05-07 DIAGNOSIS — F259 Schizoaffective disorder, unspecified: Secondary | ICD-10-CM | POA: Diagnosis not present

## 2014-07-10 DIAGNOSIS — F259 Schizoaffective disorder, unspecified: Secondary | ICD-10-CM | POA: Diagnosis not present

## 2014-09-09 DIAGNOSIS — Z803 Family history of malignant neoplasm of breast: Secondary | ICD-10-CM | POA: Diagnosis not present

## 2014-09-09 DIAGNOSIS — Z1231 Encounter for screening mammogram for malignant neoplasm of breast: Secondary | ICD-10-CM | POA: Diagnosis not present

## 2014-09-16 DIAGNOSIS — E039 Hypothyroidism, unspecified: Secondary | ICD-10-CM | POA: Diagnosis not present

## 2014-09-16 DIAGNOSIS — Z0000 Encounter for general adult medical examination without abnormal findings: Secondary | ICD-10-CM | POA: Diagnosis not present

## 2014-09-16 DIAGNOSIS — I679 Cerebrovascular disease, unspecified: Secondary | ICD-10-CM | POA: Diagnosis not present

## 2014-09-16 DIAGNOSIS — M899 Disorder of bone, unspecified: Secondary | ICD-10-CM | POA: Diagnosis not present

## 2014-09-16 DIAGNOSIS — I10 Essential (primary) hypertension: Secondary | ICD-10-CM | POA: Diagnosis not present

## 2014-09-16 DIAGNOSIS — F39 Unspecified mood [affective] disorder: Secondary | ICD-10-CM | POA: Diagnosis not present

## 2014-09-16 DIAGNOSIS — D649 Anemia, unspecified: Secondary | ICD-10-CM | POA: Diagnosis not present

## 2014-09-16 DIAGNOSIS — M949 Disorder of cartilage, unspecified: Secondary | ICD-10-CM | POA: Diagnosis not present

## 2014-09-16 DIAGNOSIS — R7301 Impaired fasting glucose: Secondary | ICD-10-CM | POA: Diagnosis not present

## 2014-10-07 DIAGNOSIS — F251 Schizoaffective disorder, depressive type: Secondary | ICD-10-CM | POA: Diagnosis not present

## 2014-12-31 DIAGNOSIS — F251 Schizoaffective disorder, depressive type: Secondary | ICD-10-CM | POA: Diagnosis not present

## 2015-04-08 DIAGNOSIS — F251 Schizoaffective disorder, depressive type: Secondary | ICD-10-CM | POA: Diagnosis not present

## 2015-05-19 ENCOUNTER — Encounter: Payer: Self-pay | Admitting: *Deleted

## 2015-07-02 DIAGNOSIS — F251 Schizoaffective disorder, depressive type: Secondary | ICD-10-CM | POA: Diagnosis not present

## 2015-09-30 DIAGNOSIS — Z780 Asymptomatic menopausal state: Secondary | ICD-10-CM | POA: Diagnosis not present

## 2015-09-30 DIAGNOSIS — M8589 Other specified disorders of bone density and structure, multiple sites: Secondary | ICD-10-CM | POA: Diagnosis not present

## 2015-09-30 DIAGNOSIS — Z1231 Encounter for screening mammogram for malignant neoplasm of breast: Secondary | ICD-10-CM | POA: Diagnosis not present

## 2015-09-30 DIAGNOSIS — N6002 Solitary cyst of left breast: Secondary | ICD-10-CM | POA: Diagnosis not present

## 2015-10-05 DIAGNOSIS — N6002 Solitary cyst of left breast: Secondary | ICD-10-CM | POA: Diagnosis not present

## 2015-10-05 DIAGNOSIS — Z1231 Encounter for screening mammogram for malignant neoplasm of breast: Secondary | ICD-10-CM | POA: Diagnosis not present

## 2015-10-07 DIAGNOSIS — Z0000 Encounter for general adult medical examination without abnormal findings: Secondary | ICD-10-CM | POA: Diagnosis not present

## 2015-10-07 DIAGNOSIS — M8580 Other specified disorders of bone density and structure, unspecified site: Secondary | ICD-10-CM | POA: Diagnosis not present

## 2015-10-07 DIAGNOSIS — E039 Hypothyroidism, unspecified: Secondary | ICD-10-CM | POA: Diagnosis not present

## 2015-10-07 DIAGNOSIS — R8290 Unspecified abnormal findings in urine: Secondary | ICD-10-CM | POA: Diagnosis not present

## 2015-10-07 DIAGNOSIS — I10 Essential (primary) hypertension: Secondary | ICD-10-CM | POA: Diagnosis not present

## 2015-10-07 DIAGNOSIS — R7301 Impaired fasting glucose: Secondary | ICD-10-CM | POA: Diagnosis not present

## 2015-10-07 DIAGNOSIS — Z1211 Encounter for screening for malignant neoplasm of colon: Secondary | ICD-10-CM | POA: Diagnosis not present

## 2015-10-07 DIAGNOSIS — F319 Bipolar disorder, unspecified: Secondary | ICD-10-CM | POA: Diagnosis not present

## 2015-10-07 DIAGNOSIS — M899 Disorder of bone, unspecified: Secondary | ICD-10-CM | POA: Diagnosis not present

## 2015-10-17 DIAGNOSIS — Z1212 Encounter for screening for malignant neoplasm of rectum: Secondary | ICD-10-CM | POA: Diagnosis not present

## 2015-10-17 DIAGNOSIS — Z1211 Encounter for screening for malignant neoplasm of colon: Secondary | ICD-10-CM | POA: Diagnosis not present

## 2015-10-20 DIAGNOSIS — M8580 Other specified disorders of bone density and structure, unspecified site: Secondary | ICD-10-CM | POA: Diagnosis not present

## 2015-10-21 DIAGNOSIS — F251 Schizoaffective disorder, depressive type: Secondary | ICD-10-CM | POA: Diagnosis not present

## 2016-01-20 DIAGNOSIS — F251 Schizoaffective disorder, depressive type: Secondary | ICD-10-CM | POA: Diagnosis not present

## 2016-03-20 DIAGNOSIS — H52223 Regular astigmatism, bilateral: Secondary | ICD-10-CM | POA: Diagnosis not present

## 2016-03-20 DIAGNOSIS — H5213 Myopia, bilateral: Secondary | ICD-10-CM | POA: Diagnosis not present

## 2016-03-20 DIAGNOSIS — H53413 Scotoma involving central area, bilateral: Secondary | ICD-10-CM | POA: Diagnosis not present

## 2016-03-20 DIAGNOSIS — H2513 Age-related nuclear cataract, bilateral: Secondary | ICD-10-CM | POA: Diagnosis not present

## 2016-03-20 DIAGNOSIS — H524 Presbyopia: Secondary | ICD-10-CM | POA: Diagnosis not present

## 2016-03-20 DIAGNOSIS — H40022 Open angle with borderline findings, high risk, left eye: Secondary | ICD-10-CM | POA: Diagnosis not present

## 2016-03-23 DIAGNOSIS — F251 Schizoaffective disorder, depressive type: Secondary | ICD-10-CM | POA: Diagnosis not present

## 2016-03-28 DIAGNOSIS — R2243 Localized swelling, mass and lump, lower limb, bilateral: Secondary | ICD-10-CM | POA: Diagnosis not present

## 2016-03-28 DIAGNOSIS — E039 Hypothyroidism, unspecified: Secondary | ICD-10-CM | POA: Diagnosis not present

## 2016-03-31 ENCOUNTER — Encounter (HOSPITAL_COMMUNITY): Payer: Self-pay | Admitting: Emergency Medicine

## 2016-03-31 DIAGNOSIS — E039 Hypothyroidism, unspecified: Secondary | ICD-10-CM | POA: Insufficient documentation

## 2016-03-31 DIAGNOSIS — F319 Bipolar disorder, unspecified: Secondary | ICD-10-CM | POA: Insufficient documentation

## 2016-03-31 DIAGNOSIS — M549 Dorsalgia, unspecified: Secondary | ICD-10-CM | POA: Insufficient documentation

## 2016-03-31 DIAGNOSIS — D649 Anemia, unspecified: Secondary | ICD-10-CM | POA: Diagnosis not present

## 2016-03-31 DIAGNOSIS — Z87891 Personal history of nicotine dependence: Secondary | ICD-10-CM | POA: Diagnosis not present

## 2016-03-31 DIAGNOSIS — M542 Cervicalgia: Secondary | ICD-10-CM | POA: Insufficient documentation

## 2016-03-31 DIAGNOSIS — Z792 Long term (current) use of antibiotics: Secondary | ICD-10-CM | POA: Diagnosis not present

## 2016-03-31 DIAGNOSIS — I10 Essential (primary) hypertension: Secondary | ICD-10-CM | POA: Insufficient documentation

## 2016-03-31 DIAGNOSIS — Z79899 Other long term (current) drug therapy: Secondary | ICD-10-CM | POA: Diagnosis not present

## 2016-03-31 DIAGNOSIS — M7989 Other specified soft tissue disorders: Secondary | ICD-10-CM | POA: Diagnosis present

## 2016-03-31 DIAGNOSIS — E876 Hypokalemia: Secondary | ICD-10-CM | POA: Diagnosis not present

## 2016-03-31 DIAGNOSIS — L03115 Cellulitis of right lower limb: Secondary | ICD-10-CM | POA: Insufficient documentation

## 2016-03-31 LAB — D-DIMER, QUANTITATIVE (NOT AT ARMC): D DIMER QUANT: 0.49 ug{FEU}/mL (ref 0.00–0.50)

## 2016-03-31 NOTE — ED Notes (Signed)
Spoke with family member that states they received a phone call stating pt had a positive D- Dimer and was told to come to ED tonight for doppler.

## 2016-03-31 NOTE — ED Notes (Addendum)
[facility] seen [facility] this afternoon for redness and swelling to R lower leg x 5 days.  No injury.  Seen there on 4/4 for same and reported right leg swelling and erythema were not getting any better.  Dr. Clarene DukeLittle treated cellulitis today with injection of Unasyn and started pt on Augmentin.  They drew a D-dimer and told her if it was elevated she would need to f/u in the ED tomorrow for venous doppler and if it was negative she would need to follow-up at walk-in clinic tomorrow for another Unasyn injection.  Denies pain.

## 2016-04-01 ENCOUNTER — Emergency Department (HOSPITAL_COMMUNITY)
Admission: EM | Admit: 2016-04-01 | Discharge: 2016-04-01 | Disposition: A | Discharge status: Home / Self Care | Payer: Medicare Other | Attending: Emergency Medicine | Admitting: Emergency Medicine

## 2016-04-01 DIAGNOSIS — L309 Dermatitis, unspecified: Secondary | ICD-10-CM | POA: Diagnosis not present

## 2016-04-01 DIAGNOSIS — L03115 Cellulitis of right lower limb: Secondary | ICD-10-CM

## 2016-04-01 HISTORY — DX: Cellulitis, unspecified: L03.90

## 2016-04-01 NOTE — Discharge Instructions (Signed)
Elevate your legs as much as possible. Use heat on the sore area, 3-4 times a day.  Cellulitis Cellulitis is an infection of the skin and the tissue beneath it. The infected area is usually red and tender. Cellulitis occurs most often in the arms and lower legs.  CAUSES  Cellulitis is caused by bacteria that enter the skin through cracks or cuts in the skin. The most common types of bacteria that cause cellulitis are staphylococci and streptococci. SIGNS AND SYMPTOMS   Redness and warmth.  Swelling.  Tenderness or pain.  Fever. DIAGNOSIS  Your health care provider can usually determine what is wrong based on a physical exam. Blood tests may also be done. TREATMENT  Treatment usually involves taking an antibiotic medicine. HOME CARE INSTRUCTIONS   Take your antibiotic medicine as directed by your health care provider. Finish the antibiotic even if you start to feel better.  Keep the infected arm or leg elevated to reduce swelling.  Apply a warm cloth to the affected area up to 4 times per day to relieve pain.  Take medicines only as directed by your health care provider.  Keep all follow-up visits as directed by your health care provider. SEEK MEDICAL CARE IF:   You notice red streaks coming from the infected area.  Your red area gets larger or turns dark in color.  Your bone or joint underneath the infected area becomes painful after the skin has healed.  Your infection returns in the same area or another area.  You notice a swollen bump in the infected area.  You develop new symptoms.  You have a fever. SEEK IMMEDIATE MEDICAL CARE IF:   You feel very sleepy.  You develop vomiting or diarrhea.  You have a general ill feeling (malaise) with muscle aches and pains.   This information is not intended to replace advice given to you by your health care provider. Make sure you discuss any questions you have with your health care provider.   Document Released:  09/20/2005 Document Revised: 09/01/2015 Document Reviewed: 02/26/2012 Elsevier Interactive Patient Education [employer]2016 [employer]

## 2016-04-01 NOTE — ED Provider Notes (Signed)
CSN: [id number]     Arrival date & time 03/31/16  2038 History  By signing my name below, I, Placido SouLogan Joldersma, attest that this documentation has been prepared under the direction and in the presence of Mancel BaleElliott Janelle Culton, MD. Electronically Signed: Placido SouLogan Joldersma, ED Scribe. 04/01/2016. 12:35 AM.    Chief Complaint  Patient presents with  . Leg Swelling   The history is provided by the patient and a relative. No language interpreter was used.    HPI Comments: Pecolia AdesCheryl A Abdou is a 60 y.o. female with a PMHx of peripheral edema who presents to the Emergency Department for evaluation following an elevated d-dimer which was performed earlier today. [facility] for recurrent right lower leg swelling with erythema onset 4 days ago. She was treated earlier today with a Unasyn injection, rx Augmentin and potassium which she has filled but not started and had a D-dimer performed. Pt was seen initially 2 days ago at [facility] and was rx Keflex and has been taking it as prescribed as well as wearing compression stockings. She additionally reports, constant, mild, neck and back pain which is a chronic condition due to a PMHx of arthritis noting it is exacerbated by changing weather. Her PCP is Dr. Clarene DukeLittle. She denies fever, chills, CP, SOB, abd pain or any other associated symptoms at this time.   Past Medical History  Diagnosis Date  . HTN (hypertension)   . Bipolar disorder (HCC)   . Osteopenia   . Hypothyroidism   . Peripheral edema   . 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 Cellulitis    Past Surgical History  Procedure Laterality Date  . Appendectomy  2000  . Finger surgery    . Brain surgery  2008    due to  Rt PICA aneurysm rupture   Family History  Problem Relation Age of Onset  . Lung cancer Father   . Hypertension Mother   . Dementia Mother   .  Hypertension Sister   . Healthy Brother   . Healthy Sister    Social History  Substance Use Topics  . Smoking status: Former Games developermoker  . Smokeless tobacco: Never Used     Comment: quit 2008  . Alcohol Use: 0.0 oz/week    0 Standard drinks or equivalent per week     Comment: occasional wine with diner   OB History    No data available     Review of Systems  Constitutional: Negative for fever and chills.  Respiratory: Negative for shortness of breath.   Cardiovascular: Positive for leg swelling. Negative for chest pain.  Gastrointestinal: Negative for abdominal pain.  Musculoskeletal: Positive for back pain and neck pain.  Skin: Positive for color change.  All other systems reviewed and are negative.  Allergies  Asendin; Fluorescein; Lithobid; and Thorazine  Home Medications   Prior to Admission medications   Medication Sig Start Date End Date Taking? Authorizing Provider  acetaminophen (TYLENOL) 500 MG tablet Take 500 mg by mouth every 6 (six) hours as needed for mild pain.   Yes Historical Provider, MD  cephALEXin (KEFLEX) 500 MG capsule Take 500 mg by mouth 2 (two) times daily. 03/28/16  Yes Historical Provider, MD  Cholecalciferol (VITAMIN D) 2000 units tablet Take 2,000 Units by mouth daily.   Yes Historical Provider, MD  divalproex (DEPAKOTE SPRINKLE) 125 MG capsule Take 250 mg  by mouth 2 (two) times daily. 02/15/16  Yes Historical Provider, MD  hydrochlorothiazide (MICROZIDE) 12.5 MG capsule Take 25 mg by mouth daily.   Yes Historical Provider, MD  levothyroxine (SYNTHROID, LEVOTHROID) 75 MCG tablet Take 75 mcg by mouth daily. 02/15/16  Yes Historical Provider, MD  Multiple Vitamin (MULTIVITAMIN WITH MINERALS) TABS tablet Take 1 tablet by mouth daily.   Yes Historical Provider, MD  OLANZapine (ZYPREXA) 10 MG tablet Take 10 mg by mouth at bedtime. Take with olanzapine  03/23/16  Yes Historical Provider, MD  OLANZapine (ZYPREXA) 5 MG tablet Take 5 mg by mouth at bedtime. Take  with Olanzapine  02/14/16  Yes Historical Provider, MD  temazepam (RESTORIL) 30 MG capsule Take 30 mg by mouth at bedtime. 03/23/16  Yes Historical Provider, MD   BP 109/77 mmHg  Pulse 78  Temp(Src) 98.4 F (36.9 C) (Oral)  Resp 20  SpO2 96% Physical Exam  Constitutional: She is oriented to person, place, and time. She appears well-developed and well-nourished.  HENT:  Head: Normocephalic and atraumatic.  Eyes: Conjunctivae and EOM are normal. Pupils are equal, round, and reactive to light.  Neck: Normal range of motion and phonation normal. Neck supple.  Cardiovascular: Normal rate, regular rhythm and normal heart sounds.  Exam reveals no gallop and no friction rub.   No murmur heard. Pulmonary/Chest: Effort normal and breath sounds normal. No respiratory distress. She has no wheezes. She has no rales. She exhibits no tenderness.  Abdominal: Soft. She exhibits no distension. There is no tenderness. There is no guarding.  Musculoskeletal: Normal range of motion.  Neurological: She is alert and oriented to person, place, and time. She exhibits normal muscle tone.  Skin: Skin is warm and dry.  Redness of the medial right lower leg within line drawn by prior physician; no lesions of foot or toes; region is warm to the touch  Psychiatric: She has a normal mood and affect. Her behavior is normal. Judgment and thought content normal.  Nursing note and vitals reviewed.  ED Course  Procedures  DIAGNOSTIC STUDIES: Oxygen Saturation is 96% on RA, normal by my interpretation.    COORDINATION OF CARE: 12:35 AM Discussed next steps with pt. She verbalized understanding and is agreeable with the plan.   Medications - No data to display  Patient Vitals for the past 24 hrs:  BP Temp Temp src Pulse Resp SpO2  04/01/16 0145 109/77 mmHg - - 78 - 96 %  04/01/16 0130 106/77 mmHg - - 75 - 96 %  04/01/16 0115 120/99 mmHg - - 82 - 97 %  04/01/16 0100 114/78 mmHg - - 79 - 98 %  04/01/16 0045  112/73 mmHg - - 81 - 96 %  04/01/16 0030 120/87 mmHg - - 87 - 96 %  04/01/16 0015 128/86 mmHg - - 86 - 99 %  03/31/16 2322 112/76 mmHg 98.4 F (36.9 C) Oral 85 20 96 %  03/31/16 2055 133/79 mmHg 98.1 F (36.7 C) Oral 97 18 96 %    At discharge- Reevaluation with update and discussion. After initial assessment and treatment, an updated evaluation reveals No change in clinical status. Findings discussed with patient, family, all questions answered. Laneta Guerin L     Labs Review Labs Reviewed  D-DIMER, QUANTITATIVE (NOT AT [facility])    Imaging Review No results found. I have personally reviewed and evaluated these images and lab results as part of my medical decision-making.   EKG Interpretation None  MDM   Final diagnoses:  Cellulitis of right lower extremity   Right lower leg redness and swelling, most consistent with mild cellulitis. Doubt DVT, systemic bacterial infection or metabolic instability.  Nursing Notes Reviewed/ Care Coordinated Applicable Imaging Reviewed Interpretation of Laboratory Data incorporated into ED treatment  The patient appears reasonably screened and/or stabilized for discharge and I doubt any other medical condition or other [facility] requiring further screening, evaluation, or treatment in the ED at this time prior to discharge.  Plan: Home Medications- as planned with Augmentin/Unasyn; Home Treatments- rest; return here if the recommended treatment, does not improve the symptoms; Recommended follow up- PCP as scheduled   I personally performed the services described in this documentation, which was scribed in my presence. The recorded information has been reviewed and is accurate.      Mancel Bale, MD 04/01/16 [phone]

## 2016-04-05 DIAGNOSIS — L03115 Cellulitis of right lower limb: Secondary | ICD-10-CM | POA: Diagnosis not present

## 2016-04-05 DIAGNOSIS — M542 Cervicalgia: Secondary | ICD-10-CM | POA: Diagnosis not present

## 2016-04-06 DIAGNOSIS — D509 Iron deficiency anemia, unspecified: Secondary | ICD-10-CM | POA: Diagnosis not present

## 2016-04-25 ENCOUNTER — Ambulatory Visit (INDEPENDENT_AMBULATORY_CARE_PROVIDER_SITE_OTHER): Payer: Medicare Other | Admitting: Podiatry

## 2016-04-25 ENCOUNTER — Encounter: Payer: Self-pay | Admitting: Podiatry

## 2016-04-25 VITALS — BP 122/78 | HR 82 | Temp 97.7°F | Resp 14

## 2016-04-25 DIAGNOSIS — L609 Nail disorder, unspecified: Secondary | ICD-10-CM | POA: Diagnosis not present

## 2016-04-25 DIAGNOSIS — L608 Other nail disorders: Secondary | ICD-10-CM

## 2016-04-25 NOTE — Progress Notes (Signed)
   Subjective:    Patient ID: Julia Huff, female    DOB: 1956/09/01, 60 y.o.   MRN: [id number]  HPI  Patient presents today complaining of elongated toenails for the past 2 months which he is not able to self trim because of increased weight gain and inability to reach her toenails. She denies any discomfort in the toenails. She denies any professional care  Review of Systems  All other systems reviewed and are negative.      Objective:   Physical Exam  Orientated 3  Vascular: DP and PT pulses 2/4 bilaterally Capillary reflex immediate bilaterally  Neurological: Patient 10 g monofilament wire intact 5/5 bilaterally Vibratory sensation reactive bilaterally Ankle reflex equal and reactive bilaterally  Dermatological: Open skin lesions bilaterally The toenails are incurvated with normal texture and color appearance 6-10  Musculoskeletal: Pes planus bilaterally There is no restriction ankle, subtalar, midtarsal joints bilaterally      Assessment & Plan:   Assessment: Satisfactory neurovascular status Incurvated toenails 6-10 non-dystrophic  Plan: Today I told patient that her nails were normal texture and I couldn't trim the toenails and would recommend follow-up at pedicurist. Patient verbally consents. The toenails 6-10 were debrided mechanically and electronically without any bleeding  Follow-up at patient's request

## 2016-04-25 NOTE — Patient Instructions (Signed)
Today your foot examination revealed adequate pulsations and feelings in your feet Your toenails have normal texture and would be okay to trim data nail salon Do not soak in the whirlpool or have the continued cuticles manipulated

## 2016-05-08 ENCOUNTER — Ambulatory Visit: Payer: Medicare Other | Attending: Family Medicine | Admitting: Physical Therapy

## 2016-05-08 DIAGNOSIS — R293 Abnormal posture: Secondary | ICD-10-CM | POA: Insufficient documentation

## 2016-05-08 DIAGNOSIS — R29898 Other symptoms and signs involving the musculoskeletal system: Secondary | ICD-10-CM

## 2016-05-08 DIAGNOSIS — M542 Cervicalgia: Secondary | ICD-10-CM | POA: Insufficient documentation

## 2016-05-08 DIAGNOSIS — M6281 Muscle weakness (generalized): Secondary | ICD-10-CM | POA: Insufficient documentation

## 2016-05-08 DIAGNOSIS — M6289 Other specified disorders of muscle: Secondary | ICD-10-CM | POA: Insufficient documentation

## 2016-05-11 ENCOUNTER — Ambulatory Visit: Payer: Medicare Other | Admitting: Physical Therapy

## 2016-05-11 DIAGNOSIS — M542 Cervicalgia: Secondary | ICD-10-CM | POA: Diagnosis not present

## 2016-05-11 DIAGNOSIS — M6281 Muscle weakness (generalized): Secondary | ICD-10-CM | POA: Diagnosis not present

## 2016-05-11 DIAGNOSIS — R29898 Other symptoms and signs involving the musculoskeletal system: Secondary | ICD-10-CM | POA: Diagnosis not present

## 2016-05-11 DIAGNOSIS — R293 Abnormal posture: Secondary | ICD-10-CM

## 2016-05-11 DIAGNOSIS — M6289 Other specified disorders of muscle: Secondary | ICD-10-CM | POA: Diagnosis not present

## 2016-05-11 NOTE — Patient Instructions (Signed)
Instructed pt to purchase a 2" soft cervical collar to assist in holding head upright

## 2016-05-13 NOTE — Therapy (Signed)
[facility] [address] [place], [place], [address] Phone: [phone]   Fax:  [phone]  Physical Therapy Evaluation  Patient Details  Name: Julia Huff MRN: [id number] Date of Birth: September 05, 1956 Referring Provider: Dr. Catha Gosselin  Encounter Date: 05/08/2016    Past Medical History  Diagnosis Date  . HTN (hypertension)   . Bipolar disorder (HCC)   . Osteopenia   . Hypothyroidism   . Peripheral edema   . 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 Cellulitis     Past Surgical History  Procedure Laterality Date  . Appendectomy  2000  . Finger surgery    . Brain surgery  2008    due to  Rt PICA aneurysm rupture    There were no vitals filed for this visit.           [facility] PT Assessment - 05/13/16 0001    Assessment   Medical Diagnosis Neck Pain   Referring Provider Dr. Catha Gosselin   Onset Date/Surgical Date --  mid-March 2017   Precautions   Precautions Fall   Balance Screen   Has the patient fallen in the past 6 months No   Has the patient had a decrease in activity level because of a fear of falling?  No   Is the patient reluctant to leave their home because of a fear of falling?  No   Home Environment   Living Environment Private residence   Type of Home House   Prior Function   Level of Independence Independent with household mobility without device;Independent with community mobility without device;Independent with basic ADLs   Vocation On disability   AROM   Cervical Flexion 48   Cervical Extension 9   Cervical - Right Side Bend 8   Cervical - Left Side Bend 8   Cervical - Right Rotation 15   Cervical - Left Rotation 21                             PT Short Term Goals - 05/11/16 1733    PT SHORT TERM GOAL #1   Title Report at least 25% improvement  in cervical pain with holding head upright.  (06-08-16)   Time 4   Period Weeks   Status New   PT SHORT TERM GOAL #2   Title Tolerate wearing soft cerivcal collar to assist in holding head more upright.  (06-08-16)   Time 4   Period Weeks   Status New   PT SHORT TERM GOAL #3   Title Independent in HEP for cervical strengthening and ROM - cervical retraction, rotation and cervical extension.   Time 4   Period Weeks   Status New   PT SHORT TERM GOAL #4   Title Customer service manager. cervical strength by holding head upright for at least 5" without use of cervical collar.   Time 4   Period Weeks   Status New           PT Long Term Goals - 05/13/16 1929    PT LONG TERM GOAL #1   Title Pt will demonstrate ability to hold head upright for at least 10" without use of soft cervical collar.  (07-08-16)   Time 8   Period Weeks   Status New   PT LONG TERM GOAL #2  Title Report at least 50% improvement in cervical pain.  (07-08-16)   Time 8   Period Weeks   Status New   PT LONG TERM GOAL #3   Title Pt will increase cervical extension so that she is able to lie supine with </= 2 pillows with c/o minimal neck pain.  (07-08-16)   Time 8   Period Weeks   Status New   PT LONG TERM GOAL #4   Title Independent in updated HEP for cervical strengthening.  (07-08-16)   Time 8   Period Weeks   Status New             Patient will benefit from skilled therapeutic intervention in order to improve the following deficits and impairments:  Postural dysfunction, Decreased range of motion, Decreased strength, Increased muscle spasms, Hypomobility, Pain  Visit Diagnosis: Cervicalgia - Plan: PT plan of care cert/re-cert  Abnormal posture - Plan: PT plan of care cert/re-cert  Other specified disorders of muscle - Plan: PT plan of care cert/re-cert  Muscle weakness (generalized) - Plan: PT plan of care cert/re-cert  Other symptoms and signs involving the musculoskeletal system - Plan: PT plan  of care cert/re-cert     Problem List There are no active problems to display for this patient.   Kary KosDilday, Naif Alabi Suzanne, PT 05/13/2016, 7:40 PM  [facility] [address] [place], [place], [address] Phone: [phone]   Fax:  [phone]  Name: Julia Huff MRN: [id number] Date of Birth: April 09, 1956

## 2016-05-14 NOTE — Therapy (Signed)
[facility] [address] [place], [place], [address] Phone: [phone]   Fax:  [phone]  Physical Therapy Treatment  Patient Details  Name: Julia Huff MRN: [id number] Date of Birth: May 12, 1956 Referring Provider: Dr. Catha Gosselin  Encounter Date: 05/11/2016      PT End of Session - 05/14/16 1843    Visit Number 2   Number of Visits 17   Date for PT Re-Evaluation 07/07/16   Authorization Type Medicare   Authorization Time Period 05-08-16 - 07-07-16   PT Start Time 1315   PT Stop Time 1400   PT Time Calculation (min) 45 min      Past Medical History  Diagnosis Date  . HTN (hypertension)   . Bipolar disorder (HCC)   . Osteopenia   . Hypothyroidism   . Peripheral edema   . 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 Cellulitis     Past Surgical History  Procedure Laterality Date  . Appendectomy  2000  . Finger surgery    . Brain surgery  2008    due to  Rt PICA aneurysm rupture    There were no vitals filed for this visit.      Subjective Assessment - 05/14/16 1841    Subjective Pt reprots she got the cervical collar "it is helping"   Pertinent History aneurysm  - hospitalization Dec. 19, 2008 - Jan. 9, 2009   Diagnostic tests x - rays -- moderate arthritis   Patient Stated Goals be able to hold head up   Currently in Pain? No/denies                         [facility] Adult PT Treatment/Exercise - 05/14/16 0001    Manual Therapy   Manual Therapy Soft tissue mobilization;Manual Traction;Muscle Energy Technique   Manual therapy comments to cervical region - pt lying supine   Soft tissue mobilization bil. upper traps   Manual Traction distraction of cervical region   Muscle Energy Technique to upper traps      US/e-stim combo  -- [place] at 100% cont. X 8" total - 4" to R and L  upper and middle trap regions with e-stim electrodes at  13 mamp intensity - electrodes on posterior cervical region/upper trap              PT Short Term Goals - 05/11/16 1733    PT SHORT TERM GOAL #1   Title Report at least 25% improvement in cervical pain with holding head upright.  (06-08-16)   Time 4   Period Weeks   Status New   PT SHORT TERM GOAL #2   Title Tolerate wearing soft cerivcal collar to assist in holding head more upright.  (06-08-16)   Time 4   Period Weeks   Status New   PT SHORT TERM GOAL #3   Title Independent in HEP for cervical strengthening and ROM - cervical retraction, rotation and cervical extension.   Time 4   Period Weeks   Status New   PT SHORT TERM GOAL #4   Title Customer service manager. cervical strength by holding head upright for at least 5" without use of cervical collar.   Time 4   Period Weeks   Status New           PT Long Term Goals - 05/13/16  1929    PT LONG TERM GOAL #1   Title Pt will demonstrate ability to hold head upright for at least 10" without use of soft cervical collar.  (07-08-16)   Time 8   Period Weeks   Status New   PT LONG TERM GOAL #2   Title Report at least 50% improvement in cervical pain.  (07-08-16)   Time 8   Period Weeks   Status New   PT LONG TERM GOAL #3   Title Pt will increase cervical extension so that she is able to lie supine with </= 2 pillows with c/o minimal neck pain.  (07-08-16)   Time 8   Period Weeks   Status New   PT LONG TERM GOAL #4   Title Independent in updated HEP for cervical strengthening.  (07-08-16)   Time 8   Period Weeks   Status New               Plan - 05/14/16 1844    Clinical Impression Statement Pt reported relief after treatment of e-stim and ultrasound - stated neck felt much better and pt demonstrated ability to hold head upright approx. 2" without use of collar   Rehab Potential Good   PT Frequency 2x / week   PT Duration 8 weeks   PT Treatment/Interventions  ADLs/Self Care Home Management;Electrical Stimulation;Moist Heat;Ultrasound;Therapeutic exercise;Therapeutic activities;Functional mobility training;Gait training;Neuromuscular re-education;Patient/family education;Manual techniques;Passive range of motion   PT Next Visit Plan try combo estim/US ; continue manual techniques to incr. cervical AROM   PT Home Exercise Plan to be determined   Consulted and Agree with Plan of Care Patient      Patient will benefit from skilled therapeutic intervention in order to improve the following deficits and impairments:  Postural dysfunction, Decreased range of motion, Decreased strength, Increased muscle spasms, Hypomobility, Pain  Visit Diagnosis: Cervicalgia  Abnormal posture  Muscle weakness (generalized)     Problem List There are no active problems to display for this patient.   Kary KosDilday, Takya Vandivier Suzanne, PT 05/14/2016, 6:48 PM  [facility] [address] [place], [place], [address] Phone: [phone]   Fax:  [phone]  Name: Julia Huff MRN: [id number] Date of Birth: 01-13-1956

## 2016-05-16 ENCOUNTER — Encounter: Payer: Self-pay | Admitting: Physical Therapy

## 2016-05-16 ENCOUNTER — Ambulatory Visit: Payer: Medicare Other | Admitting: Physical Therapy

## 2016-05-16 DIAGNOSIS — M6289 Other specified disorders of muscle: Secondary | ICD-10-CM

## 2016-05-16 DIAGNOSIS — R293 Abnormal posture: Secondary | ICD-10-CM | POA: Diagnosis not present

## 2016-05-16 DIAGNOSIS — R29898 Other symptoms and signs involving the musculoskeletal system: Secondary | ICD-10-CM

## 2016-05-16 DIAGNOSIS — M542 Cervicalgia: Secondary | ICD-10-CM

## 2016-05-16 DIAGNOSIS — M6281 Muscle weakness (generalized): Secondary | ICD-10-CM | POA: Diagnosis not present

## 2016-05-16 NOTE — Therapy (Signed)
[facility] [address] [place], [place], [address] Phone: [phone]   Fax:  [phone]  Physical Therapy Treatment  Patient Details  Name: Julia Huff MRN: [id number] Date of Birth: [birth year] Referring Provider: Dr. Catha Gosselin  Encounter Date: 05/16/2016      PT End of Session - 05/16/16 0936    Visit Number 3   Number of Visits 17   Date for PT Re-Evaluation 07/07/16   Authorization Type Medicare   Authorization Time Period 05-08-16 - 07-07-16   PT Start Time 0932   PT Stop Time 1016   PT Time Calculation (min) 44 min      Past Medical History  Diagnosis Date  . HTN (hypertension)   . Bipolar disorder (HCC)   . Osteopenia   . Hypothyroidism   . Peripheral edema   . 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 Cellulitis     Past Surgical History  Procedure Laterality Date  . Appendectomy  2000  . Finger surgery    . Brain surgery  2008    due to  Rt PICA aneurysm rupture    There were no vitals filed for this visit.      Subjective Assessment - 05/16/16 0935    Subjective No new complaints. No falls. Collar is still helping.    Pertinent History aneurysm  - hospitalization Dec. 19, 2008 - Jan. 9, 2009   Diagnostic tests x - rays -- moderate arthritis   Patient Stated Goals be able to hold head up   Currently in Pain? Yes   Pain Score 5    Pain Location Neck   Pain Descriptors / Indicators Sore;Aching;Sharp   Pain Type Chronic pain   Pain Onset More than a month ago   Pain Frequency Intermittent   Aggravating Factors  holding head up   Pain Relieving Factors tylenol, biofreeze, therapy            [facility] Adult PT Treatment/Exercise - 05/16/16 1121    Electrical Stimulation   Electrical Stimulation Location bil cervical paraspinals   Electrical Stimulation Action decrease  pain concurent with ultrasound x 8 minutes   Electrical Stimulation Parameters 105 mA   Electrical Stimulation Goals Pain   Ultrasound   Ultrasound Location bil upper traps/paraspinals below estim   Ultrasound Parameters 100%, 1.5 w/cm2, 1 Mhz x 8 minutes   Ultrasound Goals Pain   Manual Therapy   Manual Therapy Myofascial release;Soft tissue mobilization;Passive ROM;Manual Traction;Muscle Energy Technique   Manual therapy comments to bil cervical paraspinals, upper traps, rhomboids with pt in supine with HOB elevated   Soft tissue mobilization bil upper traps, rhomboids and scapular muscles   Myofascial Release bil cervical paraspinals   Passive ROM for flexion<>extension and rotation within pain free ranges.   Manual Traction gentle cervical distraction, suboccipital release   Muscle Energy Technique to upper traps            PT Short Term Goals - 05/11/16 1733    PT SHORT TERM GOAL #1   Title Report at least 25% improvement in cervical pain with holding head upright.  (06-08-16)   Time 4   Period Weeks   Status New   PT SHORT TERM GOAL #2   Title Tolerate wearing soft cerivcal collar to assist in holding head more upright.  (06-08-16)   Time 4   Period  Weeks   Status New   PT SHORT TERM GOAL #3   Title Independent in HEP for cervical strengthening and ROM - cervical retraction, rotation and cervical extension.   Time 4   Period Weeks   Status New   PT SHORT TERM GOAL #4   Title Customer service managerDemonstrate incr. cervical strength by holding head upright for at least 5" without use of cervical collar.   Time 4   Period Weeks   Status New           PT Long Term Goals - 05/13/16 1929    PT LONG TERM GOAL #1   Title Pt will demonstrate ability to hold head upright for at least 10" without use of soft cervical collar.  (07-08-16)   Time 8   Period Weeks   Status New   PT LONG TERM GOAL #2   Title Report at least 50% improvement in cervical pain.  (07-08-16)   Time 8   Period Weeks    Status New   PT LONG TERM GOAL #3   Title Pt will increase cervical extension so that she is able to lie supine with </= 2 pillows with c/o minimal neck pain.  (07-08-16)   Time 8   Period Weeks   Status New   PT LONG TERM GOAL #4   Title Independent in updated HEP for cervical strengthening.  (07-08-16)   Time 8   Period Weeks   Status New           Plan - 05/16/16 0936    Clinical Impression Statement Continued to work on pain reduction, range of motion and gentle strengthening of cervical muscles. Pt with decreased tolerance to cervical extension/lying flat, therefore kept pt elevated with table and pillows for all manual therapy.    Rehab Potential Good   PT Frequency 2x / week   PT Duration 8 weeks   PT Treatment/Interventions ADLs/Self Care Home Management;Electrical Stimulation;Moist Heat;Ultrasound;Therapeutic exercise;Therapeutic activities;Functional mobility training;Gait training;Neuromuscular re-education;Patient/family education;Manual techniques;Passive range of motion   PT Next Visit Plan combo estim/US ; continue manual techniques to incr. cervical AROM   PT Home Exercise Plan to be determined   Consulted and Agree with Plan of Care Patient      Patient will benefit from skilled therapeutic intervention in order to improve the following deficits and impairments:  Postural dysfunction, Decreased range of motion, Decreased strength, Increased muscle spasms, Hypomobility, Pain  Visit Diagnosis: Cervicalgia  Abnormal posture  Muscle weakness (generalized)  Other specified disorders of muscle  Other symptoms and signs involving the musculoskeletal system     Problem List There are no active problems to display for this patient.   Sallyanne KusterKathy Bury, PTA, [facility] [address], [address] [place], [place] [address] [phone] 05/16/2016, 12:57 PM   Name: Julia Huff MRN: [id number] Date of Birth: August 23, 1956

## 2016-05-17 ENCOUNTER — Encounter: Payer: Self-pay | Admitting: Physical Therapy

## 2016-05-17 ENCOUNTER — Ambulatory Visit: Payer: Medicare Other | Admitting: Physical Therapy

## 2016-05-17 DIAGNOSIS — R293 Abnormal posture: Secondary | ICD-10-CM

## 2016-05-17 DIAGNOSIS — M6281 Muscle weakness (generalized): Secondary | ICD-10-CM | POA: Diagnosis not present

## 2016-05-17 DIAGNOSIS — R29898 Other symptoms and signs involving the musculoskeletal system: Secondary | ICD-10-CM

## 2016-05-17 DIAGNOSIS — M6289 Other specified disorders of muscle: Secondary | ICD-10-CM

## 2016-05-17 DIAGNOSIS — M542 Cervicalgia: Secondary | ICD-10-CM

## 2016-05-17 NOTE — Therapy (Signed)
[facility] [address] [place], [place], [address] Phone: [phone]   Fax:  [phone]  Physical Therapy Treatment  Patient Details  Name: Julia Huff MRN: [id number] Date of Birth: May 12, 1956 Referring Provider: Dr. Catha Gosselin  Encounter Date: 05/17/2016      PT End of Session - 05/17/16 1026    Visit Number 4   Number of Visits 17   Date for PT Re-Evaluation 07/07/16   Authorization Type Medicare   Authorization Time Period 05-08-16 - 07-07-16   PT Start Time 1017   PT Stop Time 1100   PT Time Calculation (min) 43 min      Past Medical History  Diagnosis Date  . HTN (hypertension)   . Bipolar disorder (HCC)   . Osteopenia   . Hypothyroidism   . Peripheral edema   . 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 Cellulitis     Past Surgical History  Procedure Laterality Date  . Appendectomy  2000  . Finger surgery    . Brain surgery  2008    due to  Rt PICA aneurysm rupture    There were no vitals filed for this visit.      Subjective Assessment - 05/17/16 1024    Subjective No new complaints. No falls. Collar is still helping.  Did the exercises from yesterday's session at home last night and feels looser in the left side of her neck, still tight on right.   Diagnostic tests x - rays -- moderate arthritis   Currently in Pain? Yes   Pain Score 5    Pain Location Neck   Pain Orientation Right   Pain Descriptors / Indicators Aching;Sore   Pain Type Chronic pain   Pain Onset More than a month ago   Pain Frequency Intermittent   Aggravating Factors  holding head up   Pain Relieving Factors tylenol, biofreeze, therapy           [facility] Adult PT Treatment/Exercise - 05/17/16 1442    Neck Exercises: Seated   Other Seated Exercise with pt seated with head/arms supported on mat table  in front of her (with pillows and angled end of mat table): head lifts x 10 reps in pain free ranges against gravity                 Electrical Stimulation   Electrical Stimulation Location bil cervical paraspinals near scalp line   Electrical Stimulation Action decrease pain concurrent with ultrasound x 8 minutes   Electrical Stimulation Parameters 105 volt   Electrical Stimulation Goals Pain   Ultrasound   Ultrasound Location bil cervical paraspinals just below the estim electrodes   Ultrasound Parameters 100%, 1.5 w/cm2, 1 mHz x 8 minutes concurrent with ultrasound   Ultrasound Goals Pain   Manual Therapy   Manual Therapy Soft tissue mobilization;Myofascial release;Neural Stretch;Muscle Energy Technique;Other (comment)   Manual therapy comments had pt seated today with head propped on pillows on mat table at an incline (postition been using for combo US/e-stim): pt found to be very tight along scpaular areas bil side and along cervical surgery scar. Scar mobs performed before anything else in attempt to loose up scar adhesions and trigger points. soft tissue mobs and subocciptal release then performed to further increase tissue/muscle extensibility. Pt also with muscle guarding in upper traps, rhomboids and subscapular areas, manual therapy to  theses areas as well.                                 Soft tissue mobilization bil upper traps, rhomboids and scapular muscles   Myofascial Release bil cervical paraspinals   Passive ROM brought pt up into supported sitting against back of chair with manual support to head and neck: passive flexion<>extension progressing to active motions with support, passive rotation progressing toward active motions with support. all within pain free ranges.                           Manual Traction in suppported seated position: gentle cervical distraction   Muscle Energy Technique to upper traps and cervical paraspinals for decreased pain, postual re-education            PT Short Term Goals - 05/11/16 1733    PT SHORT TERM GOAL #1   Title Report at least 25% improvement in cervical pain with holding head upright.  (06-08-16)   Time 4   Period Weeks   Status New   PT SHORT TERM GOAL #2   Title Tolerate wearing soft cerivcal collar to assist in holding head more upright.  (06-08-16)   Time 4   Period Weeks   Status New   PT SHORT TERM GOAL #3   Title Independent in HEP for cervical strengthening and ROM - cervical retraction, rotation and cervical extension.   Time 4   Period Weeks   Status New   PT SHORT TERM GOAL #4   Title Customer service managerDemonstrate incr. cervical strength by holding head upright for at least 5" without use of cervical collar.   Time 4   Period Weeks   Status New           PT Long Term Goals - 05/13/16 1929    PT LONG TERM GOAL #1   Title Pt will demonstrate ability to hold head upright for at least 10" without use of soft cervical collar.  (07-08-16)   Time 8   Period Weeks   Status New   PT LONG TERM GOAL #2   Title Report at least 50% improvement in cervical pain.  (07-08-16)   Time 8   Period Weeks   Status New   PT LONG TERM GOAL #3   Title Pt will increase cervical extension so that she is able to lie supine with </= 2 pillows with c/o minimal neck pain.  (07-08-16)   Time 8   Period Weeks   Status New   PT LONG TERM GOAL #4   Title Independent in updated HEP for cervical strengthening.  (07-08-16)   Time 8   Period Weeks   Status New           Plan - 05/17/16 1026    Clinical Impression Statement Today's session continued to address cervical pain and weakness. Pt better tolerated manual therapy in seated position today vs in modified supine position from yesterday's session. Pt with more elevated head/cervical extension noted at end of sesison today.   Rehab Potential Good   PT Frequency 2x / week   PT Duration 8 weeks   PT Treatment/Interventions ADLs/Self Care Home Management;Electrical Stimulation;Moist  Heat;Ultrasound;Therapeutic exercise;Therapeutic activities;Functional mobility training;Gait training;Neuromuscular re-education;Patient/family education;Manual techniques;Passive range of motion   PT Next Visit Plan combo estim/US ; continue manual techniques to incr. cervical AROM   PT Home  Exercise Plan to be determined   Consulted and Agree with Plan of Care Patient      Patient will benefit from skilled therapeutic intervention in order to improve the following deficits and impairments:  Postural dysfunction, Decreased range of motion, Decreased strength, Increased muscle spasms, Hypomobility, Pain  Visit Diagnosis: Cervicalgia  Abnormal posture  Muscle weakness (generalized)  Other specified disorders of muscle  Other symptoms and signs involving the musculoskeletal system     Problem List There are no active problems to display for this patient.   Sallyanne Kuster, PTA, [facility] [address] [place], [place] [address] [phone] 05/17/2016, 4:23 PM   Name: JENAVEVE FENSTERMAKER MRN: [id number] Date of Birth: 1956/06/21

## 2016-05-18 ENCOUNTER — Ambulatory Visit: Payer: Medicare Other | Admitting: Physical Therapy

## 2016-05-18 DIAGNOSIS — R293 Abnormal posture: Secondary | ICD-10-CM

## 2016-05-18 DIAGNOSIS — M542 Cervicalgia: Secondary | ICD-10-CM | POA: Diagnosis not present

## 2016-05-18 DIAGNOSIS — R29898 Other symptoms and signs involving the musculoskeletal system: Secondary | ICD-10-CM | POA: Diagnosis not present

## 2016-05-18 DIAGNOSIS — M6289 Other specified disorders of muscle: Secondary | ICD-10-CM | POA: Diagnosis not present

## 2016-05-18 DIAGNOSIS — M6281 Muscle weakness (generalized): Secondary | ICD-10-CM

## 2016-05-18 NOTE — Patient Instructions (Signed)
AROM: Neck Rotation    Turn head slowly to look over one shoulder, then the other. Hold each position _3-5___ seconds. Repeat __10__ times per set. Do _1___ sets per session. Do _3___ sessions per day.  http://orth.exer.us/295   Copyright  VHI. All rights reserved.  (Clinic) Extension / Rotation: Cervico-Thoracic (Mid Cervical Support)    Sit with trunk leaning back, towel or wedge at base of neck. Tuck chin in and rotate head toward right, same hand behind head. Extend spine by pressing lower neck against wedge. Repeat _10___ times per set. Do _1___ sets per session. Do __5__ sessions per week.  Copyright  VHI. All rights reserved.  AROM: Lateral Neck Flexion    Slowly tilt head toward one shoulder, then the other. Hold each position _3-5___ seconds. Repeat _10___ times per set. Do __1__ sets per session. Do __3__ sessions per day.  http://orth.exer.us/297   Copyright  VHI. All rights reserved.

## 2016-05-22 NOTE — Therapy (Signed)
[facility] [address] [place], [place], [address] Phone: [phone]   Fax:  [phone]  Physical Therapy Treatment  Patient Details  Name: Julia Huff MRN: [id number] Date of Birth: [birth year] Referring Provider: Dr. Catha GosselinKevin Little  Encounter Date: 05/18/2016      PT End of Session - 05/22/16 0747    Visit Number 5   Number of Visits 17   Date for PT Re-Evaluation 07/07/16   Authorization Type Medicare   Authorization Time Period 05-08-16 - 07-07-16   PT Start Time 1102   PT Stop Time 1146   PT Time Calculation (min) 44 min      Past Medical History  Diagnosis Date  . HTN (hypertension)   . Bipolar disorder (HCC)   . Osteopenia   . Hypothyroidism   . Peripheral edema   . 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 Cellulitis     Past Surgical History  Procedure Laterality Date  . Appendectomy  2000  . Finger surgery    . Brain surgery  2008    due to  Rt PICA aneurysm rupture    There were no vitals filed for this visit.      Subjective Assessment - 05/22/16 0744    Subjective Pt reports she feels that she is able to hold her head up better now   Pertinent History aneurysm  - hospitalization Dec. 19, 2008 - Jan. 9, 2009   Diagnostic tests x - rays -- moderate arthritis   Patient Stated Goals be able to hold head up   Currently in Pain? Yes   Pain Score 5    Pain Location Neck   Pain Orientation Right   Pain Descriptors / Indicators Aching;Sore   Pain Type Chronic pain   Pain Onset More than a month ago   Pain Frequency Intermittent      Combo E-stim ( 110-115V) with ultrasound 1.5 w/cm2 100% continuous -- electrodes for e-stim placed on posterior cervical Region - 1 on either side of spine;  US performed 4" to R side, 4" to L side upper and middle trap area - pt leaning forward  on 3 pillows  Manual therapy and soft tissue mobilization to posterior cervical and bil. Upper trap region  TherEx:  AAROM cervical rotation R and L, extension, and lateral flexion;  Isometrics for rotation and lateral flexion and extension - approx. 7-8 reps Each with 3 sec hold  Instructed in cervical AROM for HEP, as well as performing cervical extension by holding head upright without use of cervical collar as long as possible - To tolerance prior to fatigue                          PT Education - 05/22/16 0746    Education provided Yes   Education Details see pt instructions - cervical  AROM exercises   Person(s) Educated Patient   Methods Explanation;Demonstration;Handout   Comprehension Verbalized understanding;Returned demonstration          PT Short Term Goals - 05/11/16 1733    PT SHORT TERM GOAL #1   Title Report at least 25% improvement in cervical pain with holding head upright.  (06-08-16)   Time 4   Period Weeks   Status New   PT SHORT TERM GOAL #2   Title Tolerate wearing soft  cerivcal collar to assist in holding head more upright.  (06-08-16)   Time 4   Period Weeks   Status New   PT SHORT TERM GOAL #3   Title Independent in HEP for cervical strengthening and ROM - cervical retraction, rotation and cervical extension.   Time 4   Period Weeks   Status New   PT SHORT TERM GOAL #4   Title Customer service manager. cervical strength by holding head upright for at least 5" without use of cervical collar.   Time 4   Period Weeks   Status New           PT Long Term Goals - 05/13/16 1929    PT LONG TERM GOAL #1   Title Pt will demonstrate ability to hold head upright for at least 10" without use of soft cervical collar.  (07-08-16)   Time 8   Period Weeks   Status New   PT LONG TERM GOAL #2   Title Report at least 50% improvement in cervical pain.  (07-08-16)   Time 8   Period Weeks   Status New   PT LONG TERM GOAL #3   Title Pt will  increase cervical extension so that she is able to lie supine with </= 2 pillows with c/o minimal neck pain.  (07-08-16)   Time 8   Period Weeks   Status New   PT LONG TERM GOAL #4   Title Independent in updated HEP for cervical strengthening.  (07-08-16)   Time 8   Period Weeks   Status New               Plan - 05/22/16 1914    Clinical Impression Statement Pt demonstrating increased cervical extension strength, however, continues to have significant cervical musc. weakness with decreased muscle endurance strength.  Pt able to hold head more upright after treatment. Trigger points persisit in bil. upper traps.   Rehab Potential Good   PT Frequency 3x / week   PT Duration 8 weeks   PT Treatment/Interventions ADLs/Self Care Home Management;Electrical Stimulation;Moist Heat;Ultrasound;Therapeutic exercise;Therapeutic activities;Functional mobility training;Gait training;Neuromuscular re-education;Patient/family education;Manual techniques;Passive range of motion   PT Next Visit Plan combo estim/US ; continue manual techniques to incr. cervical AROM   PT Home Exercise Plan cervical AROM - plan to add strengthening as tolerated   Consulted and Agree with Plan of Care Patient      Patient will benefit from skilled therapeutic intervention in order to improve the following deficits and impairments:  Postural dysfunction, Decreased range of motion, Decreased strength, Increased muscle spasms, Hypomobility, Pain  Visit Diagnosis: Muscle weakness (generalized)  Abnormal posture     Problem List There are no active problems to display for this patient.   Julia Huff, PT 05/22/2016, 7:57 AM  [facility] [address] [place], [place], [address] Phone: [phone]   Fax:  [phone]  Name: Julia Huff MRN: [id number] Date of Birth: 03/06/1956

## 2016-05-24 ENCOUNTER — Encounter: Payer: Self-pay | Admitting: Physical Therapy

## 2016-05-24 ENCOUNTER — Ambulatory Visit: Payer: Medicare Other | Admitting: Physical Therapy

## 2016-05-24 DIAGNOSIS — M542 Cervicalgia: Secondary | ICD-10-CM | POA: Diagnosis not present

## 2016-05-24 DIAGNOSIS — R293 Abnormal posture: Secondary | ICD-10-CM | POA: Diagnosis not present

## 2016-05-24 DIAGNOSIS — M6289 Other specified disorders of muscle: Secondary | ICD-10-CM | POA: Diagnosis not present

## 2016-05-24 DIAGNOSIS — R29898 Other symptoms and signs involving the musculoskeletal system: Secondary | ICD-10-CM

## 2016-05-24 DIAGNOSIS — M6281 Muscle weakness (generalized): Secondary | ICD-10-CM

## 2016-05-24 NOTE — Therapy (Signed)
[facility] [address] [place], [place], [address] Phone: [phone]   Fax:  [phone]  Physical Therapy Treatment  Patient Details  Name: Julia Huff MRN: [id number] Date of Birth: January 29, 1956 Referring Provider: Dr. Catha Gosselin  Encounter Date: 05/24/2016      PT End of Session - 05/24/16 0851    Visit Number 6   Number of Visits 17   Date for PT Re-Evaluation 07/07/16   Authorization Type Medicare   Authorization Time Period 05-08-16 - 07-07-16   PT Start Time 0847   PT Stop Time 0930   PT Time Calculation (min) 43 min      Past Medical History  Diagnosis Date  . HTN (hypertension)   . Bipolar disorder (HCC)   . Osteopenia   . Hypothyroidism   . Peripheral edema   . 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 Cellulitis     Past Surgical History  Procedure Laterality Date  . Appendectomy  2000  . Finger surgery    . Brain surgery  2008    due to  Rt PICA aneurysm rupture    There were no vitals filed for this visit.      Subjective Assessment - 05/24/16 0851    Subjective No new complaints. Pt reports she feels the exercises and therapy are helping.   Pertinent History aneurysm  - hospitalization Dec. 19, 2008 - Jan. 9, 2009   Diagnostic tests x - rays -- moderate arthritis   Patient Stated Goals be able to hold head up   Currently in Pain? Yes   Pain Score 3    Pain Location Neck   Pain Descriptors / Indicators Sore;Aching   Pain Type Chronic pain   Pain Onset More than a month ago   Pain Frequency Intermittent   Aggravating Factors  holding head up   Pain Relieving Factors tylenal, biofreeze, therapy             [facility] Adult PT Treatment/Exercise - 05/24/16 0852    Neck Exercises: Seated   Cervical Isometrics Extension;Right lateral flexion;Left lateral flexion;3  secs;5 reps;Limitations   Cervical Isometrics Limitations manual stabilization provided by PTA with isometrics   Neck Retraction 10 reps;3 secs;Limitations   Neck Retraction Limitations PTA provided stabilzation and manual facilitation to prevent compensatory motions                                Cervical Rotation Both;10 reps;Limitations   Cervical Rotation Limitations PROM to AAROM    Other Seated Exercise PRONE: cervical extension, "head lifts" x 10 reps   Other Seated Exercise PRONE: scapular retraction with manual cues x 10 reps   Electrical Stimulation   Electrical Stimulation Location bil cervical paraspinals near scalp line   Electrical Stimulation Action decrease pain concurrent with ultrasound x 8 minutes   Electrical Stimulation Parameters 105 volt   Electrical Stimulation Goals Pain   Ultrasound   Ultrasound Location bil cervical paraspinals just below the estim electrodes   Ultrasound Parameters 100%, 1.5 w/cm2, 1 Mhz x 8 minutes concurrent with estim   Ultrasound Goals Pain   Manual Therapy   Manual Therapy Soft tissue mobilization;Myofascial release;Neural Stretch;Muscle Energy Technique;Other (comment)   Manual therapy comments started with pt in supine and then progressed to prone position for manual therapy  and exercises   Soft tissue mobilization bil upper traps, rhomboids and scapular muscles   Myofascial Release bil cervical paraspinals   Passive ROM all directions and incorporated into stretching/exercises   Manual Traction in supine position with sustained holds x 4-5 reps; suboccipital release also performed in supine.                     Muscle Energy Technique to upper traps and cervical paraspinals for decreased pain, postual re-education            PT Short Term Goals - 05/11/16 1733    PT SHORT TERM GOAL #1   Title Report at least 25% improvement in cervical pain with holding head upright.  (06-08-16)   Time 4   Period Weeks   Status New   PT SHORT  TERM GOAL #2   Title Tolerate wearing soft cerivcal collar to assist in holding head more upright.  (06-08-16)   Time 4   Period Weeks   Status New   PT SHORT TERM GOAL #3   Title Independent in HEP for cervical strengthening and ROM - cervical retraction, rotation and cervical extension.   Time 4   Period Weeks   Status New   PT SHORT TERM GOAL #4   Title Customer service managerDemonstrate incr. cervical strength by holding head upright for at least 5" without use of cervical collar.   Time 4   Period Weeks   Status New           PT Long Term Goals - 05/13/16 1929    PT LONG TERM GOAL #1   Title Pt will demonstrate ability to hold head upright for at least 10" without use of soft cervical collar.  (07-08-16)   Time 8   Period Weeks   Status New   PT LONG TERM GOAL #2   Title Report at least 50% improvement in cervical pain.  (07-08-16)   Time 8   Period Weeks   Status New   PT LONG TERM GOAL #3   Title Pt will increase cervical extension so that she is able to lie supine with </= 2 pillows with c/o minimal neck pain.  (07-08-16)   Time 8   Period Weeks   Status New   PT LONG TERM GOAL #4   Title Independent in updated HEP for cervical strengthening.  (07-08-16)   Time 8   Period Weeks   Status New           Plan - 05/24/16 16100852    Clinical Impression Statement Pt continues to demonstrate slow progressing toward goals with increased active cervical extension. Fatigues quickly and continues to need assist of cervical collar for sustained cervical extension.   Rehab Potential Good   PT Frequency 3x / week   PT Duration 8 weeks   PT Treatment/Interventions ADLs/Self Care Home Management;Electrical Stimulation;Moist Heat;Ultrasound;Therapeutic exercise;Therapeutic activities;Functional mobility training;Gait training;Neuromuscular re-education;Patient/family education;Manual techniques;Passive range of motion   PT Next Visit Plan combo estim/US ; continue manual techniques to incr. cervical  AROM, continue with gentle strengthening as well   PT Home Exercise Plan cervical AROM - plan to add strengthening as tolerated   Consulted and Agree with Plan of Care Patient      Patient will benefit from skilled therapeutic intervention in order to improve the following deficits and impairments:  Postural dysfunction, Decreased range of motion, Decreased strength, Increased muscle spasms, Hypomobility, Pain  Visit Diagnosis: Muscle weakness (generalized)  Abnormal posture  Cervicalgia  Other specified disorders of muscle  Other symptoms and signs involving the musculoskeletal system     Problem List There are no active problems to display for this patient.   Sallyanne Kuster, PTA, [facility] Outpatient Neuro [facility] [address] [place], [place] [address] [phone] 05/24/2016, 6:54 PM   Name: Julia Huff MRN: [id number] Date of Birth: May 12, 1956

## 2016-05-25 ENCOUNTER — Encounter: Payer: Self-pay | Admitting: Physical Therapy

## 2016-05-25 ENCOUNTER — Ambulatory Visit: Payer: Medicare Other | Attending: Family Medicine | Admitting: Physical Therapy

## 2016-05-25 DIAGNOSIS — M6289 Other specified disorders of muscle: Secondary | ICD-10-CM

## 2016-05-25 DIAGNOSIS — M542 Cervicalgia: Secondary | ICD-10-CM | POA: Insufficient documentation

## 2016-05-25 DIAGNOSIS — R293 Abnormal posture: Secondary | ICD-10-CM | POA: Diagnosis not present

## 2016-05-25 DIAGNOSIS — M6281 Muscle weakness (generalized): Secondary | ICD-10-CM | POA: Diagnosis not present

## 2016-05-25 DIAGNOSIS — R29898 Other symptoms and signs involving the musculoskeletal system: Secondary | ICD-10-CM | POA: Diagnosis not present

## 2016-05-26 ENCOUNTER — Ambulatory Visit: Payer: Medicare Other | Admitting: Physical Therapy

## 2016-05-26 ENCOUNTER — Encounter: Payer: Self-pay | Admitting: Physical Therapy

## 2016-05-26 DIAGNOSIS — R293 Abnormal posture: Secondary | ICD-10-CM

## 2016-05-26 DIAGNOSIS — M6289 Other specified disorders of muscle: Secondary | ICD-10-CM | POA: Diagnosis not present

## 2016-05-26 DIAGNOSIS — R29898 Other symptoms and signs involving the musculoskeletal system: Secondary | ICD-10-CM

## 2016-05-26 DIAGNOSIS — M6281 Muscle weakness (generalized): Secondary | ICD-10-CM

## 2016-05-26 DIAGNOSIS — M542 Cervicalgia: Secondary | ICD-10-CM

## 2016-05-26 NOTE — Therapy (Signed)
[facility] [address] [place], [place], [address] Phone: [phone]   Fax:  [phone]  Physical Therapy Treatment  Patient Details  Name: Julia Huff MRN: [id number] Date of Birth: 05-Oct-1956 Referring Provider: Dr. Catha Gosselin  Encounter Date: 05/25/2016      PT End of Session - 05/25/16 1452    Visit Number 7   Number of Visits 17   Date for PT Re-Evaluation 07/07/16   Authorization Type Medicare   Authorization Time Period 05-08-16 - 07-07-16   PT Start Time 1447   PT Stop Time 1530   PT Time Calculation (min) 43 min   Activity Tolerance Patient tolerated treatment well   Behavior During Therapy [facility] for tasks assessed/performed      Past Medical History  Diagnosis Date  . HTN (hypertension)   . Bipolar disorder (HCC)   . Osteopenia   . Hypothyroidism   . Peripheral edema   . 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 Cellulitis     Past Surgical History  Procedure Laterality Date  . Appendectomy  2000  . Finger surgery    . Brain surgery  2008    due to  Rt PICA aneurysm rupture    There were no vitals filed for this visit.      Subjective Assessment - 05/25/16 1451    Subjective No new complaints. Pt reports she feels the exercises and therapy are helping.   Pertinent History aneurysm  - hospitalization Dec. 19, 2008 - Jan. 9, 2009   Diagnostic tests x - rays -- moderate arthritis   Patient Stated Goals be able to hold head up   Currently in Pain? Yes   Pain Score 1   3 with lifting head up further into extension   Pain Location Neck   Pain Orientation Right   Pain Descriptors / Indicators Aching;Sore   Pain Type Chronic pain   Pain Onset More than a month ago   Pain Frequency Intermittent   Aggravating Factors  holding head up   Pain Relieving Factors tylenol, biofreeze,  therapy            [facility] Adult PT Treatment/Exercise - 05/25/16 1452    Neck Exercises: Seated   Cervical Isometrics Extension;Right lateral flexion;Left lateral flexion;3 secs;5 reps;Limitations   Cervical Isometrics Limitations manual stabilization provided by PTA with isometrics   Neck Retraction 10 reps;3 secs;Limitations   Neck Retraction Limitations PTA provided stabilzation and manual facilitation to prevent compensatory motions                                Cervical Rotation Both;10 reps;Limitations   Cervical Rotation Limitations PROM to AAROM    Other Seated Exercise PRONE: cervical extension, "head lifts" x 10 reps, 2 sets   Other Seated Exercise PRONE: scapular retraction with manual cues x 10 reps, 2 sets   Electrical Stimulation   Electrical Stimulation Location bil cervical paraspinals near scalp line   Electrical Stimulation Action for decreased pain concurrent with ultrasound x 8 minutes   Electrical Stimulation Parameters 105 volt   Electrical Stimulation Goals Pain   Ultrasound   Ultrasound Location bil cervical paraspinals just below estim   Ultrasound Parameters 100%, 1.5 w/cm2, 1 mhz x 8 minutes concurrent with estim   Ultrasound Goals Pain  Manual Therapy   Manual Therapy Soft tissue mobilization;Myofascial release;Neural Stretch;Muscle Energy Technique;Other (comment)   Manual therapy comments started with pt in supine and then progressed to prone position for manual therapy and exercises   Soft tissue mobilization bil upper traps, rhomboids and scapular muscles   Myofascial Release bil cervical paraspinals   Passive ROM all directions and incorporated into stretching/exercises   Manual Traction in supine position with sustained holds x 4-5 reps; suboccipital release also performed in supine.                     Muscle Energy Technique to upper traps and cervical paraspinals for decreased pain, postual re-education             PT Short Term Goals -  05/11/16 1733    PT SHORT TERM GOAL #1   Title Report at least 25% improvement in cervical pain with holding head upright.  (06-08-16)   Time 4   Period Weeks   Status New   PT SHORT TERM GOAL #2   Title Tolerate wearing soft cerivcal collar to assist in holding head more upright.  (06-08-16)   Time 4   Period Weeks   Status New   PT SHORT TERM GOAL #3   Title Independent in HEP for cervical strengthening and ROM - cervical retraction, rotation and cervical extension.   Time 4   Period Weeks   Status New   PT SHORT TERM GOAL #4   Title Customer service manager. cervical strength by holding head upright for at least 5" without use of cervical collar.   Time 4   Period Weeks   Status New           PT Long Term Goals - 05/13/16 1929    PT LONG TERM GOAL #1   Title Pt will demonstrate ability to hold head upright for at least 10" without use of soft cervical collar.  (07-08-16)   Time 8   Period Weeks   Status New   PT LONG TERM GOAL #2   Title Report at least 50% improvement in cervical pain.  (07-08-16)   Time 8   Period Weeks   Status New   PT LONG TERM GOAL #3   Title Pt will increase cervical extension so that she is able to lie supine with </= 2 pillows with c/o minimal neck pain.  (07-08-16)   Time 8   Period Weeks   Status New   PT LONG TERM GOAL #4   Title Independent in updated HEP for cervical strengthening.  (07-08-16)   Time 8   Period Weeks   Status New           Plan - 05/25/16 1452    Clinical Impression Statement Pt now reporting decreased pain overall and able to perform increased reps with exercises today without increased pain. Advised pt to get a larger (thicker) cervical collar at this time as she now has more cervical extension to allow for this. Pt agreed. PT to continue to progress cervical extension strengthening and to continue to work on decreased pain.   Rehab Potential Good   PT Frequency 3x / week   PT Duration 8 weeks   PT  Treatment/Interventions ADLs/Self Care Home Management;Electrical Stimulation;Moist Heat;Ultrasound;Therapeutic exercise;Therapeutic activities;Functional mobility training;Gait training;Neuromuscular re-education;Patient/family education;Manual techniques;Passive range of motion   PT Next Visit Plan combo estim/US ; continue manual techniques to incr. cervical AROM, continue with gentle strengthening as well   PT Home  Exercise Plan cervical AROM - plan to add strengthening as tolerated   Consulted and Agree with Plan of Care Patient      Patient will benefit from skilled therapeutic intervention in order to improve the following deficits and impairments:  Postural dysfunction, Decreased range of motion, Decreased strength, Increased muscle spasms, Hypomobility, Pain  Visit Diagnosis: Muscle weakness (generalized)  Abnormal posture  Cervicalgia  Other specified disorders of muscle  Other symptoms and signs involving the musculoskeletal system     Problem List There are no active problems to display for this patient.   Sallyanne KusterKathy Carrye Goller, PTA, [facility] [address] [place], [place] [address] [phone] 05/26/2016, 8:40 AM   Name: Julia Huff MRN: [id number] Date of Birth: Aug 26, 1956

## 2016-05-27 NOTE — Therapy (Signed)
[facility] [address] [place], [place], [address] Phone: [phone]   Fax:  [phone]  Physical Therapy Treatment  Patient Details  Name: Julia Huff MRN: [id number] Date of Birth: [birth year] Referring Provider: Dr. Catha Gosselin  Encounter Date: 05/26/2016      PT End of Session - 05/26/16 1238    Visit Number 8   Number of Visits 17   Date for PT Re-Evaluation 07/07/16   Authorization Type Medicare   Authorization Time Period 05-08-16 - 07-07-16   PT Start Time 1236  SCAT was late   PT Stop Time 1331   PT Time Calculation (min) 55 min   Activity Tolerance Patient tolerated treatment well   Behavior During Therapy [facility] for tasks assessed/performed      Past Medical History  Diagnosis Date  . HTN (hypertension)   . Bipolar disorder (HCC)   . Osteopenia   . Hypothyroidism   . Peripheral edema   . 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 Cellulitis     Past Surgical History  Procedure Laterality Date  . Appendectomy  2000  . Finger surgery    . Brain surgery  2008    due to  Rt PICA aneurysm rupture    There were no vitals filed for this visit.      Subjective Assessment - 05/26/16 1237    Subjective No new complaints. Pt reports she feels the exercises and therapy are helping.   Pertinent History aneurysm  - hospitalization Dec. 19, 2008 - Jan. 9, 2009   Diagnostic tests x - rays -- moderate arthritis   Patient Stated Goals be able to hold head up   Currently in Pain? No/denies   Pain Score 0-No pain            [facility] Adult PT Treatment/Exercise - 05/26/16 1258    Neck Exercises: Seated   Other Seated Exercise PRONE: cervical extension, "head lifts" x 10 reps, 2 sets with 5 sec holds each time   Other Seated Exercise PRONE: scapular retraction with manual cues x 10 reps, 2  sets, 5 sec holds each time   Neck Exercises: Supine   Cervical Isometrics Extension;5 secs;10 reps;Limitations   Cervical Isometrics Limitations pt pushing head down into PTA hands, 2 sets performed.   Cervical Rotation Both;10 reps;Limitations   Cervical Rotation Limitations head resting on pillow in neutral position   Moist Heat Therapy   Number Minutes Moist Heat 15 Minutes  concurrent with estim   Moist Heat Location Cervical   Electrical Stimulation   Electrical Stimulation Location premod to bil cervical paraspinals near scalp line   Electrical Stimulation Action for decreased pain   Electrical Stimulation Parameters intensity to tolerance   Electrical Stimulation Goals Pain   Manual Therapy   Manual Therapy Soft tissue mobilization;Myofascial release;Neural Stretch;Muscle Energy Technique;Other (comment)   Manual therapy comments started with pt in supine and then progressed to prone position for manual therapy and exercises   Soft tissue mobilization bil upper traps, rhomboids and scapular muscles   Myofascial Release bil cervical paraspinals   Passive ROM all directions and incorporated into stretching/exercises   Manual Traction in supine position with sustained holds x 4-5 reps; suboccipital release also performed in supine.                     Muscle  Energy Technique to upper traps and cervical paraspinals for decreased pain, postual re-education             PT Short Term Goals - 05/11/16 1733    PT SHORT TERM GOAL #1   Title Report at least 25% improvement in cervical pain with holding head upright.  (06-08-16)   Time 4   Period Weeks   Status New   PT SHORT TERM GOAL #2   Title Tolerate wearing soft cerivcal collar to assist in holding head more upright.  (06-08-16)   Time 4   Period Weeks   Status New   PT SHORT TERM GOAL #3   Title Independent in HEP for cervical strengthening and ROM - cervical retraction, rotation and cervical extension.   Time 4   Period  Weeks   Status New   PT SHORT TERM GOAL #4   Title Customer service managerDemonstrate incr. cervical strength by holding head upright for at least 5" without use of cervical collar.   Time 4   Period Weeks   Status New           PT Long Term Goals - 05/13/16 1929    PT LONG TERM GOAL #1   Title Pt will demonstrate ability to hold head upright for at least 10" without use of soft cervical collar.  (07-08-16)   Time 8   Period Weeks   Status New   PT LONG TERM GOAL #2   Title Report at least 50% improvement in cervical pain.  (07-08-16)   Time 8   Period Weeks   Status New   PT LONG TERM GOAL #3   Title Pt will increase cervical extension so that she is able to lie supine with </= 2 pillows with c/o minimal neck pain.  (07-08-16)   Time 8   Period Weeks   Status New   PT LONG TERM GOAL #4   Title Independent in updated HEP for cervical strengthening.  (07-08-16)   Time 8   Period Weeks   Status New            Plan - 05/26/16 1238    Clinical Impression Statement Pt was able to tolerate increased exercises today. Continues to present as tight with trigger points with manual therapy. Changed to e-stim with moist heat today as pt reports the stim is helping more than the US was and this allowed for increased time on estim. Pt is also to purchase a wider cervical collar (4 inches) to promote increased cervical extenison. Pt is making slow, steady progress toward goals.                                  Rehab Potential Good   PT Frequency 3x / week   PT Duration 8 weeks   PT Treatment/Interventions ADLs/Self Care Home Management;Electrical Stimulation;Moist Heat;Ultrasound;Therapeutic exercise;Therapeutic activities;Functional mobility training;Gait training;Neuromuscular re-education;Patient/family education;Manual techniques;Passive range of motion   PT Next Visit Plan continue manual techniques to incr. cervical AROM, continue with gentle strengthening as well;modalities as needed for pain   PT Home  Exercise Plan cervical AROM - plan to add strengthening as tolerated   Consulted and Agree with Plan of Care Patient      Patient will benefit from skilled therapeutic intervention in order to improve the following deficits and impairments:  Postural dysfunction, Decreased range of motion, Decreased strength, Increased muscle spasms, Hypomobility, Pain  Visit Diagnosis: Muscle  weakness (generalized)  Abnormal posture  Cervicalgia  Other specified disorders of muscle  Other symptoms and signs involving the musculoskeletal system     Problem List There are no active problems to display for this patient.   Sallyanne Kuster, PTA, [facility] Outpatient Neuro [facility] [address] [place], [place] [address] [phone] 05/27/2016, 6:49 PM   Name: Julia Huff MRN: [id number] Date of Birth: 1956-08-03

## 2016-05-30 ENCOUNTER — Ambulatory Visit: Payer: Medicare Other | Admitting: Physical Therapy

## 2016-05-30 DIAGNOSIS — M6289 Other specified disorders of muscle: Secondary | ICD-10-CM | POA: Diagnosis not present

## 2016-05-30 DIAGNOSIS — R29898 Other symptoms and signs involving the musculoskeletal system: Secondary | ICD-10-CM | POA: Diagnosis not present

## 2016-05-30 DIAGNOSIS — M542 Cervicalgia: Secondary | ICD-10-CM

## 2016-05-30 DIAGNOSIS — R293 Abnormal posture: Secondary | ICD-10-CM

## 2016-05-30 DIAGNOSIS — M6281 Muscle weakness (generalized): Secondary | ICD-10-CM | POA: Diagnosis not present

## 2016-05-30 NOTE — Therapy (Signed)
[facility] [address] [place], [place], [address] Phone: [phone]   Fax:  [phone]  Physical Therapy Treatment  Patient Details  Name: ILINA XU MRN: [id number] Date of Birth: [birth year] Referring Provider: Dr. Catha Gosselin  Encounter Date: 05/30/2016      PT End of Session - 05/30/16 1349    Visit Number 9   Number of Visits 17   Date for PT Re-Evaluation 07/07/16   Authorization Type Medicare   Authorization Time Period 05-08-16 - 07-07-16   PT Start Time 1147   PT Stop Time 1237   PT Time Calculation (min) 50 min      Past Medical History  Diagnosis Date  . HTN (hypertension)   . Bipolar disorder (HCC)   . Osteopenia   . Hypothyroidism   . Peripheral edema   . 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 Cellulitis     Past Surgical History  Procedure Laterality Date  . Appendectomy  2000  . Finger surgery    . Brain surgery  2008    due to  Rt PICA aneurysm rupture    There were no vitals filed for this visit.      Subjective Assessment - 05/30/16 1332    Subjective Pt reports she bought a new 4" soft cervical collar from [employer] - "I thought you could help me put it on"   Pertinent History aneurysm  - hospitalization Dec. 19, 2008 - Jan. 9, 2009   Patient Stated Goals be able to hold head up   Currently in Pain? No/denies                         [facility] Adult PT Treatment/Exercise - 05/30/16 1205    Neck Exercises: Supine   Cervical Isometrics Extension;20 reps;3 secs   Neck Retraction 10 reps;3 secs   Cervical Rotation Both;10 reps   Lateral Flexion Both;10 reps   Neck Exercises: Prone   Axial Exentsion 20 reps  initially with min assist to complete full ROM   Electrical Stimulation   Electrical Stimulation Action to decrease pain and decr. trigger  point in R upper trap   Electrical Stimulation Goals Pain   Ultrasound   Ultrasound Location L upper trap and L posterior  cervical region: R upper trap below electrodes   Ultrasound Parameters 100%, 1.5 w/cm2 x 8" simultaneously with e-stim   Manual Therapy   Manual Therapy Soft tissue mobilization;Myofascial release;Neural Stretch;Muscle Energy Technique;Other (comment)   Manual therapy comments began in supine position with use of 2 pillows; progressed to use of only 1 pillow: then progressed to prone posiition   Soft tissue mobilization bil. upper traps, and L posterior cervical region   Myofascial Release bil cervical paraspinals   Passive ROM R and L rotation, lateral flexion and extension   Manual Traction in supine with 15 sec hold sustained - 5 reps      Pt performed bil. Shoulder extension x 10 reps in prone position; with elbows flexed at 90 degrees - scapular retraction In prone position - 2 sets 10 reps;  Cervical extension with bil. Scapular retraction - 5 reps with 3 sec hold in extension   E-stim intensity at 85V  With the 2 elctrodes on R upper trap and R lateral posterior cervical region       PT  Education - 05/30/16 1348    Education provided Yes   Education Details recommended pt begin cervical extension in prone position at home   Person(s) Educated Patient   Methods Explanation;Demonstration;Handout   Comprehension Verbalized understanding;Returned demonstration          PT Short Term Goals - 05/11/16 1733    PT SHORT TERM GOAL #1   Title Report at least 25% improvement in cervical pain with holding head upright.  (06-08-16)   Time 4   Period Weeks   Status New   PT SHORT TERM GOAL #2   Title Tolerate wearing soft cerivcal collar to assist in holding head more upright.  (06-08-16)   Time 4   Period Weeks   Status New   PT SHORT TERM GOAL #3   Title Independent in HEP for cervical strengthening and ROM - cervical retraction, rotation and cervical  extension.   Time 4   Period Weeks   Status New   PT SHORT TERM GOAL #4   Title Customer service managerDemonstrate incr. cervical strength by holding head upright for at least 5" without use of cervical collar.   Time 4   Period Weeks   Status New           PT Long Term Goals - 05/13/16 1929    PT LONG TERM GOAL #1   Title Pt will demonstrate ability to hold head upright for at least 10" without use of soft cervical collar.  (07-08-16)   Time 8   Period Weeks   Status New   PT LONG TERM GOAL #2   Title Report at least 50% improvement in cervical pain.  (07-08-16)   Time 8   Period Weeks   Status New   PT LONG TERM GOAL #3   Title Pt will increase cervical extension so that she is able to lie supine with </= 2 pillows with c/o minimal neck pain.  (07-08-16)   Time 8   Period Weeks   Status New   PT LONG TERM GOAL #4   Title Independent in updated HEP for cervical strengthening.  (07-08-16)   Time 8   Period Weeks   Status New               Plan - 05/30/16 1350    Clinical Impression Statement Pt tolerating increased cervical strengthening and ROM exercises; pt is now able to tolerate lying supine with use of only 1 pillow and demonstrates ability to extend head in prone positon:  4" cervical collar placed on patient at end of session, increasing cervical extension - pt tolerated well                                                                                                                        Rehab Potential Good   PT Frequency 3x / week   PT Duration 8 weeks   PT Treatment/Interventions ADLs/Self Care Home Management;Electrical Stimulation;Moist Heat;Ultrasound;Therapeutic exercise;Therapeutic activities;Functional mobility training;Gait training;Neuromuscular re-education;Patient/family education;Manual techniques;Passive  range of motion   PT Next Visit Plan continue manual techniques to incr. cervical AROM, continue with gentle strengthening as well;modalities as needed for pain    PT Home Exercise Plan cervical AROM - plan to add strengthening as tolerated; ADDED cervical extension prone on 05-30-16   Consulted and Agree with Plan of Care Patient      Patient will benefit from skilled therapeutic intervention in order to improve the following deficits and impairments:  Postural dysfunction, Decreased range of motion, Decreased strength, Increased muscle spasms, Hypomobility, Pain  Visit Diagnosis: Cervicalgia  Other symptoms and signs involving the musculoskeletal system  Abnormal posture     Problem List There are no active problems to display for this patient.   ZOXWRU, EAVWU [id number], PT 05/30/2016, 1:59 PM  [facility] [address] [place], [place], [address] Phone: [phone]   Fax:  [phone]  Name: TAYLINN BRABANT MRN: [id number] Date of Birth: 15-Aug-1956

## 2016-05-31 ENCOUNTER — Ambulatory Visit: Payer: Medicare Other | Admitting: Physical Therapy

## 2016-05-31 ENCOUNTER — Encounter: Payer: Self-pay | Admitting: Physical Therapy

## 2016-05-31 DIAGNOSIS — R29898 Other symptoms and signs involving the musculoskeletal system: Secondary | ICD-10-CM | POA: Diagnosis not present

## 2016-05-31 DIAGNOSIS — M542 Cervicalgia: Secondary | ICD-10-CM | POA: Diagnosis not present

## 2016-05-31 DIAGNOSIS — R293 Abnormal posture: Secondary | ICD-10-CM | POA: Diagnosis not present

## 2016-05-31 DIAGNOSIS — M6289 Other specified disorders of muscle: Secondary | ICD-10-CM

## 2016-05-31 DIAGNOSIS — M6281 Muscle weakness (generalized): Secondary | ICD-10-CM | POA: Diagnosis not present

## 2016-05-31 NOTE — Therapy (Signed)
[facility] [address] [place], [place], [address] Phone: [phone]   Fax:  [phone]  Physical Therapy Treatment  Patient Details  Name: Julia Huff MRN: [id number] Date of Birth: 28-Mar-1956 Referring Provider: Dr. Catha Gosselin  Encounter Date: 05/31/2016      PT End of Session - 05/31/16 1252    Visit Number 10  G2   Number of Visits 17   Date for PT Re-Evaluation 07/07/16   Authorization Type Medicare   Authorization Time Period 05-08-16 - 07-07-16   PT Start Time 1230   PT Stop Time 1323   PT Time Calculation (min) 53 min      Past Medical History  Diagnosis Date  . HTN (hypertension)   . Bipolar disorder (HCC)   . Osteopenia   . Hypothyroidism   . Peripheral edema   . 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 Cellulitis     Past Surgical History  Procedure Laterality Date  . Appendectomy  2000  . Finger surgery    . Brain surgery  2008    due to  Rt PICA aneurysm rupture    There were no vitals filed for this visit.      Subjective Assessment - 05/31/16 1235    Subjective No new complaints. Pt did need cues on collar positoin as it is was not on correctly on arrival.    Pertinent History aneurysm  - hospitalization Dec. 19, 2008 - Jan. 9, 2009   Diagnostic tests x - rays -- moderate arthritis   Patient Stated Goals be able to hold head up   Currently in Pain? No/denies   Pain Score 0-No pain             [facility] Adult PT Treatment/Exercise - 05/31/16 1312    Neck Exercises: Standing   Upper Extremity Flexion with Stabilization Flexion;10 reps;Limitations   UE Flexion with Stabilization Limitations rolling red ball up<>down wall while tracking ball with head to promote increased cervical extension motion and strengthening x 10 reps                                 Neck  Exercises: Seated   Cervical Rotation Both;10 reps;Limitations   Cervical Rotation Limitations AROM with emphasiso on maintaining cervical extension as well.   Shoulder ABduction Both;5 reps;Limitations   Shoulder Abduction Limitations horizontal shoulder abduction with pt tracking hand with each rep laterally to work on cervical motion in addition to scapular mobility, cues and emphasis on cervical extension as well                         Other Seated Exercise seated: cervical flexion<>extension x 10 reps within pain free ranges AROM   Other Seated Exercise seated: alternating UE diagnols with pt tracking hand with head each rep, x 5 on each side. cues on scapular depression, cervical extension and to stay in pain free ranges                           Neck Exercises: Supine   Cervical Isometrics Extension;5 secs;Limitations;20 reps   Cervical Isometrics Limitations manual stabilization provided to maintain neutral head positoin and prevent compensatory motions, pt pushing head down into PTA hands/pillow, hold  for 5 sec's, 2 sets of 10 reps   Neck Exercises: Prone   Axial Exentsion 10 reps;Limitations   Axial Extension Limitations Head lifts off towel roll in neutral position, 5 sec holds x 10 reps, actively with full clearance from towel/mat surface   W Back 10 reps;Limitations   W Back Limitations "W" position, bil arm lifts togethr with scapular squeeze, 5 sec holds x 10 reps   Shoulder Extension 10 reps;Limitations   Shoulder Extension Limitations "aquaman", arms lifting together for 5 sec holds   Upper Extremity Flexion with Stabilization Flexion;10 reps;Limitations   UE Flexion with Stabilization Limitations "superman" with bil arms lifting together, 5 sec hols   Other Prone Exercise shoulder horizontal abduction "birdman", bil arms lifting together, 5 sec holds.    Moist Heat Therapy   Number Minutes Moist Heat 10 Minutes  concurrent with pre mod e-stim   Moist Heat Location Cervical   along scapular/thoracic area   Electrical Stimulation   Electrical Stimulation Location premod to bil lower cervical paraspinals   Electrical Stimulation Action to decrease pain and decrease trigger points   Electrical Stimulation Parameters intensity to tolerance   Electrical Stimulation Goals Pain   Manual Therapy   Manual Therapy Soft tissue mobilization;Myofascial release;Neural Stretch;Muscle Energy Technique;Other (comment)   Manual therapy comments began in supine position with one pillow and progressed to prone with towel roll under pt's forehead. No palpable trigger points noted today with manual therapy. Pt continues to present with tight muscles. Decreased guarding with cues to relax.                          Soft tissue mobilization bil cervical paraspinals, upper traps, and rhomboids.   Myofascial Release bil cervical paraspinals and upper traps.   Passive ROM all directions passively and incorporated into stretching   Manual Traction in supine with sustained holds x 5 reps; suboccipitial release performed as well in supine            PT Education - 05/31/16 1350    Education provided Yes   Education Details Pt is to remove cervical collar 3x a day for 10 minutes while is supported, seated position (ie watching TV) and maintain upright/cervical extension to work on strengthening and edurance of cervical extension.   Person(s) Educated Patient   Methods Explanation;Demonstration   Comprehension Verbalized understanding;Need further instruction          PT Short Term Goals - 05/11/16 1733    PT SHORT TERM GOAL #1   Title Report at least 25% improvement in cervical pain with holding head upright.  (06-08-16)   Time 4   Period Weeks   Status New   PT SHORT TERM GOAL #2   Title Tolerate wearing soft cerivcal collar to assist in holding head more upright.  (06-08-16)   Time 4   Period Weeks   Status New   PT SHORT TERM GOAL #3   Title Independent in HEP for cervical  strengthening and ROM - cervical retraction, rotation and cervical extension.   Time 4   Period Weeks   Status New   PT SHORT TERM GOAL #4   Title Customer service managerDemonstrate incr. cervical strength by holding head upright for at least 5" without use of cervical collar.   Time 4   Period Weeks   Status New           PT Long Term Goals - 05/13/16 1929    PT  LONG TERM GOAL #1   Title Pt will demonstrate ability to hold head upright for at least 10" without use of soft cervical collar.  (07-08-16)   Time 8   Period Weeks   Status New   PT LONG TERM GOAL #2   Title Report at least 50% improvement in cervical pain.  (07-08-16)   Time 8   Period Weeks   Status New   PT LONG TERM GOAL #3   Title Pt will increase cervical extension so that she is able to lie supine with </= 2 pillows with c/o minimal neck pain.  (07-08-16)   Time 8   Period Weeks   Status New   PT LONG TERM GOAL #4   Title Independent in updated HEP for cervical strengthening.  (07-08-16)   Time 8   Period Weeks   Status New            Plan - 05/31/16 1252    Clinical Impression Statement Today's session continued to address cervical range of motion, strengthening and pain with increased activity. Pt is making steady progress toward goals.    Rehab Potential Good   PT Frequency 3x / week   PT Duration 8 weeks   PT Treatment/Interventions ADLs/Self Care Home Management;Electrical Stimulation;Moist Heat;Ultrasound;Therapeutic exercise;Therapeutic activities;Functional mobility training;Gait training;Neuromuscular re-education;Patient/family education;Manual techniques;Passive range of motion   PT Next Visit Plan continue manual techniques to incr. cervical AROM, continue with gentle strengthening as well;modalities as needed for pain   PT Home Exercise Plan cervical AROM - plan to add strengthening as tolerated; ADDED cervical extension prone on 05-30-16   Consulted and Agree with Plan of Care Patient      Patient will benefit  from skilled therapeutic intervention in order to improve the following deficits and impairments:  Postural dysfunction, Decreased range of motion, Decreased strength, Increased muscle spasms, Hypomobility, Pain  Visit Diagnosis: Cervicalgia  Other symptoms and signs involving the musculoskeletal system  Abnormal posture  Other specified disorders of muscle     Problem List There are no active problems to display for this patient.   Sallyanne Kuster, PTA, [facility] Outpatient Neuro [facility] [address] [place], [place] [address] [phone] 06/01/2016, 8:23 AM   Name: Julia Huff MRN: [id number] Date of Birth: 08-13-1956

## 2016-06-01 ENCOUNTER — Ambulatory Visit: Payer: Medicare Other | Admitting: Physical Therapy

## 2016-06-01 ENCOUNTER — Encounter: Payer: Self-pay | Admitting: Physical Therapy

## 2016-06-01 DIAGNOSIS — M6281 Muscle weakness (generalized): Secondary | ICD-10-CM | POA: Diagnosis not present

## 2016-06-01 DIAGNOSIS — R293 Abnormal posture: Secondary | ICD-10-CM | POA: Diagnosis not present

## 2016-06-01 DIAGNOSIS — R29898 Other symptoms and signs involving the musculoskeletal system: Secondary | ICD-10-CM

## 2016-06-01 DIAGNOSIS — M6289 Other specified disorders of muscle: Secondary | ICD-10-CM | POA: Diagnosis not present

## 2016-06-01 DIAGNOSIS — M542 Cervicalgia: Secondary | ICD-10-CM | POA: Diagnosis not present

## 2016-06-04 NOTE — Therapy (Signed)
[facility] [address] [place], [place], [address] Phone: [phone]   Fax:  [phone]  Physical Therapy Treatment  Patient Details  Name: Julia Huff MRN: [id number] Date of Birth: 1956-11-24 Referring Provider: Dr. Catha Gosselin  Encounter Date: 06/01/2016   06/01/16 1104  PT Visits / Re-Eval  Visit Number 11 (G3)  Number of Visits 17  Date for PT Re-Evaluation 07/07/16  Authorization  Authorization Type Medicare  Authorization Time Period 05-08-16 - 07-07-16  PT Time Calculation  PT Start Time 1102  PT Stop Time 1145  PT Time Calculation (min) 43 min     Past Medical History  Diagnosis Date  . HTN (hypertension)   . Bipolar disorder (HCC)   . Osteopenia   . Hypothyroidism   . Peripheral edema   . 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 Cellulitis     Past Surgical History  Procedure Laterality Date  . Appendectomy  2000  . Finger surgery    . Brain surgery  2008    due to  Rt PICA aneurysm rupture    There were no vitals filed for this visit.     06/01/16 1103  Symptoms/Limitations  Subjective No new complaints. No pain to report. Was not sore after exercise advancement yesterday.  Pertinent History aneurysm  - hospitalization Dec. 19, 2008 - Jan. 9, 2009  Diagnostic tests x - rays -- moderate arthritis  Patient Stated Goals be able to hold head up  Pain Assessment  Currently in Pain? No/denies  Pain Score 0      06/01/16 1105  Neck Exercises: Standing  Upper Extremity Flexion with Stabilization Flexion;10 reps;Limitations  UE Flexion with Stabilization Limitations rolling red ball up<>down wall while tracking ball with head to promote increased cervical extension motion and strengthening x 10 reps                                Neck Exercises: Seated  Cervical Rotation  Both;10 reps;Limitations  Cervical Rotation Limitations AROM with emphasiso on maintaining cervical extension as well.  Shoulder ABduction Both;Limitations;10 reps  Shoulder Abduction Limitations shoulder horizontal abduction with pt tracking hands for increased cervical rotation while holding head up for cervical extension, x 10 each way                               Other Seated Exercise seated: cervical flexion<>extension x 10 reps within pain free ranges AROM  Other Seated Exercise seated: alternating UE diagnols with pt tracking hand with head each rep, x 5 on each side. cues on scapular depression, cervical extension and to stay in pain free ranges                          Neck Exercises: Supine  Cervical Isometrics Extension;5 secs;Limitations;20 reps  Cervical Isometrics Limitations manual stabilization provided to maintain neutral head positoin and prevent compensatory motions, pt pushing head down into PTA hands/pillow, hold for 5 sec's, 2 sets of 10 reps  Neck Exercises: Prone  Axial Exentsion 10 reps;Limitations  Axial Extension Limitations Head lifts off towel roll in neutral position, 5 sec holds x 10 reps, actively with full clearance from towel/mat surface  W Back 10 reps;Limitations  W Back Limitations "W" position, bil arm lifts togethr with scapular squeeze, 5 sec holds x 10 reps  Shoulder Extension 10 reps;Limitations  Shoulder Extension Limitations "aqua man", arms lifting up togetherfor 5 sec holds  Upper Extremity Flexion with Stabilization Flexion;10 reps;Limitations  UE Flexion with Stabilization Limitations "superman" with bil arms lifting together, 5 sec hols  Other Prone Exercise shoulder horizontal abduction "birdman", bil arms lifting together, 5 sec holds.   Moist Heat Therapy  Number Minutes Moist Heat 10 Minutes (concurrent with moist hot pack)  Moist Heat Location Cervical  Electrical Stimulation  Electrical Stimulation Location premod to bil lower cervical  paraspinals  Electrical Stimulation Action to decrease pain and trigger points, concurrent with moist hot pack  Electrical Stimulation Parameters intensity to tolerance  Electrical Stimulation Goals Pain  Manual Therapy  Manual Therapy Soft tissue mobilization;Myofascial release;Neural Stretch;Muscle Energy Technique;Other (comment)  Manual therapy comments began in supine position with one pillow and progressed to prone with towel roll under pt's forehead. No palpable trigger points noted today with manual therapy. Pt continues to present with tight muscles. Decreased guarding with cues to relax.                         Soft tissue mobilization bil cervical paraspinals, upper traps, and rhomboids.  Myofascial Release bil cervical paraspinals and upper traps.  Passive ROM all directions passively and incorporated into stretching  Manual Traction in supine with sustained holds x 5 reps; suboccipitial release performed as well in supine            PT Short Term Goals - 05/11/16 1733    PT SHORT TERM GOAL #1   Title Report at least 25% improvement in cervical pain with holding head upright.  (06-08-16)   Time 4   Period Weeks   Status New   PT SHORT TERM GOAL #2   Title Tolerate wearing soft cerivcal collar to assist in holding head more upright.  (06-08-16)   Time 4   Period Weeks   Status New   PT SHORT TERM GOAL #3   Title Independent in HEP for cervical strengthening and ROM - cervical retraction, rotation and cervical extension.   Time 4   Period Weeks   Status New   PT SHORT TERM GOAL #4   Title Customer service manager. cervical strength by holding head upright for at least 5" without use of cervical collar.   Time 4   Period Weeks   Status New           PT Long Term Goals - 05/13/16 1929    PT LONG TERM GOAL #1   Title Pt will demonstrate ability to hold head upright for at least 10" without use of soft cervical collar.  (07-08-16)   Time 8   Period Weeks   Status New    PT LONG TERM GOAL #2   Title Report at least 50% improvement in cervical pain.  (07-08-16)   Time 8   Period Weeks   Status New   PT LONG TERM GOAL #3   Title Pt will increase cervical extension so that she is able to lie supine with </= 2 pillows with c/o minimal neck pain.  (07-08-16)   Time 8   Period Weeks   Status New   PT LONG TERM GOAL #4   Title Independent in updated HEP for cervical strengthening.  (07-08-16)   Time 8   Period Weeks  Status New        06/01/16 1104  Plan  Clinical Impression Statement Continued to work on strengthening and decreasing pt's pain with todays session.   Pt will benefit from skilled therapeutic intervention in order to improve on the following deficits Postural dysfunction;Decreased range of motion;Decreased strength;Increased muscle spasms;Hypomobility;Pain  Rehab Potential Good  PT Frequency 3x / week  PT Duration 8 weeks  PT Treatment/Interventions ADLs/Self Care Home Management;Electrical Stimulation;Moist Heat;Ultrasound;Therapeutic exercise;Therapeutic activities;Functional mobility training;Gait training;Neuromuscular re-education;Patient/family education;Manual techniques;Passive range of motion  PT Next Visit Plan continue manual techniques to incr. cervical AROM, continue with gentle strengthening as well;modalities as needed for pain  PT Home Exercise Plan cervical AROM - plan to add strengthening as tolerated; ADDED cervical extension prone on 05-30-16  Consulted and Agree with Plan of Care Patient     Patient will benefit from skilled therapeutic intervention in order to improve the following deficits and impairments:  Postural dysfunction, Decreased range of motion, Decreased strength, Increased muscle spasms, Hypomobility, Pain  Visit Diagnosis: Cervicalgia  Other symptoms and signs involving the musculoskeletal system  Abnormal posture  Other specified disorders of muscle  Muscle weakness (generalized)     Problem  List There are no active problems to display for this patient.   Sallyanne KusterKathy Leelan Rajewski, PTA, [facility] Outpatient [facility] [address], [address] [place], [place] [address] [phone] 06/04/2016, 4:16 PM   Name: Julia Huff MRN: [id number] Date of Birth: Oct 02, 1956

## 2016-06-05 ENCOUNTER — Ambulatory Visit: Payer: Managed Care, Other (non HMO) | Admitting: Physical Therapy

## 2016-06-07 ENCOUNTER — Ambulatory Visit: Payer: Medicare Other | Admitting: Physical Therapy

## 2016-06-07 ENCOUNTER — Encounter: Payer: Self-pay | Admitting: Physical Therapy

## 2016-06-07 DIAGNOSIS — M6281 Muscle weakness (generalized): Secondary | ICD-10-CM

## 2016-06-07 DIAGNOSIS — M6289 Other specified disorders of muscle: Secondary | ICD-10-CM

## 2016-06-07 DIAGNOSIS — M542 Cervicalgia: Secondary | ICD-10-CM

## 2016-06-07 DIAGNOSIS — R293 Abnormal posture: Secondary | ICD-10-CM | POA: Diagnosis not present

## 2016-06-07 DIAGNOSIS — R29898 Other symptoms and signs involving the musculoskeletal system: Secondary | ICD-10-CM

## 2016-06-09 ENCOUNTER — Encounter: Payer: Self-pay | Admitting: Physical Therapy

## 2016-06-09 ENCOUNTER — Ambulatory Visit: Payer: Medicare Other | Admitting: Physical Therapy

## 2016-06-09 DIAGNOSIS — R293 Abnormal posture: Secondary | ICD-10-CM | POA: Diagnosis not present

## 2016-06-09 DIAGNOSIS — M542 Cervicalgia: Secondary | ICD-10-CM

## 2016-06-09 DIAGNOSIS — R29898 Other symptoms and signs involving the musculoskeletal system: Secondary | ICD-10-CM | POA: Diagnosis not present

## 2016-06-09 DIAGNOSIS — M6289 Other specified disorders of muscle: Secondary | ICD-10-CM

## 2016-06-09 DIAGNOSIS — M6281 Muscle weakness (generalized): Secondary | ICD-10-CM | POA: Diagnosis not present

## 2016-06-09 NOTE — Therapy (Signed)
[facility] [address] [place], [place], [address] Phone: [phone]   Fax:  [phone]  Physical Therapy Treatment  Patient Details  Name: Julia Huff MRN: [id number] Date of Birth: Aug 29, 1956 Referring Provider: Dr. Catha Gosselin  Encounter Date: 06/07/2016   06/07/16 1454  PT Visits / Re-Eval  Visit Number 12 (G4)  Number of Visits 17  Date for PT Re-Evaluation 07/07/16  Authorization  Authorization Type Medicare  Authorization Time Period 05-08-16 - 07-07-16  PT Time Calculation  PT Start Time 1450  PT Stop Time 1530  PT Time Calculation (min) 40 min     Past Medical History  Diagnosis Date  . HTN (hypertension)   . Bipolar disorder (HCC)   . Osteopenia   . Hypothyroidism   . Peripheral edema   . 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 Cellulitis     Past Surgical History  Procedure Laterality Date  . Appendectomy  2000  . Finger surgery    . Brain surgery  2008    due to  Rt PICA aneurysm rupture    There were no vitals filed for this visit.     06/07/16 1453  Symptoms/Limitations  Subjective No new complaints. No falls. Reports HEP going well. including holding head up without brace 3 x day for up to 20 minutes before fatigue sets in.  Pertinent History aneurysm  - hospitalization Dec. 19, 2008 - Jan. 9, 2009  Diagnostic tests x - rays -- moderate arthritis  Patient Stated Goals be able to hold head up  Pain Assessment  Currently in Pain? No/denies  Pain Score 0      06/07/16 1455  Neck Exercises: Machines for Strengthening  UBE (Upper Arm Bike) level 1.0 x 1 minute forward and 1 minute backwards with emphasis on cervical extension.  Neck Exercises: Supine  Cervical Isometrics Extension;5 secs;Limitations;10 reps  Cervical Isometrics Limitations manual stabilization  provided to maintain neutral head positoin and prevent compensatory motions, pt pushing head down into PTA hands/pillow, hold for 5 sec's, 2 sets of 10 reps  Shoulder Flexion Both;10 reps;Weights  Shoulder Flexion Weights (lbs) 1  Shoulder Flexion Limitations with chin tuck: alternating UE raises x 10 each side, cues to go as high as possible in pain free ranges  Other Supine Exercise "W" with scapular retraction using 1# hand weights, 10 reps concurrent with chin tuck  Other Supine Exercise shoulder horizontal abduciton with yellow theraband, x 10 reps concurrent with chin tucks. cues on form and to slow down return with band.  Neck Exercises: Prone  Axial Exentsion 10 reps;Limitations  Axial Extension Limitations Head lifts off towel roll in neutral position, 5 sec holds x 10 reps, actively with full clearance from towel/mat surface  W Back 10 reps;Limitations  W Back Limitations "W" position, bil arm lifts together with scapular squeeze, 5 sec holds x 10 reps  Shoulder Extension 10 reps;Limitations  Shoulder Extension Limitations "aqua man" with arms lifting simultaneously, 5 sec holds  Upper Extremity Flexion with Stabilization Flexion;10 reps;Limitations  UE Flexion with Stabilization Limitations "superman" with bil arms lifting together, 5 sec hols  Other Prone Exercise shoulder horizontal abduction "birdman", bil arms lifting together, 5 sec holds.   Moist Heat Therapy  Number Minutes Moist Heat 10 Minutes (concurrent with estim)  Moist Heat Location Cervical  Electrical Stimulation  Electrical Stimulation Location premod to  bil lower cervical paraspinals  Electrical Stimulation Action to decrease pain and muscle tightness, concurrent with moist hot pack  Electrical Stimulation Parameters intensity to tolerance  Electrical Stimulation Goals Pain  Manual Therapy  Manual Therapy Soft tissue mobilization;Neural Stretch;Muscle Energy Technique  Manual therapy comments began in supine  position with one pillow and progressed to prone with towel roll under pt's forehead. No palpable trigger points noted today with manual therapy. Pt continues to present with tight muscles. Decreased guarding with cues to relax.                         Soft tissue mobilization bil cervical paraspinals, upper traps, and rhomboids.  Muscle Energy Technique to upper traps and cervical paraspinals for decreased pain, postual re-education  Neural Stretch for decreased tightness associated with cervical extension            PT Short Term Goals - 05/11/16 1733    PT SHORT TERM GOAL #1   Title Report at least 25% improvement in cervical pain with holding head upright.  (06-08-16)   Time 4   Period Weeks   Status New   PT SHORT TERM GOAL #2   Title Tolerate wearing soft cerivcal collar to assist in holding head more upright.  (06-08-16)   Time 4   Period Weeks   Status New   PT SHORT TERM GOAL #3   Title Independent in HEP for cervical strengthening and ROM - cervical retraction, rotation and cervical extension.   Time 4   Period Weeks   Status New   PT SHORT TERM GOAL #4   Title Customer service managerDemonstrate incr. cervical strength by holding head upright for at least 5" without use of cervical collar.   Time 4   Period Weeks   Status New           PT Long Term Goals - 05/13/16 1929    PT LONG TERM GOAL #1   Title Pt will demonstrate ability to hold head upright for at least 10" without use of soft cervical collar.  (07-08-16)   Time 8   Period Weeks   Status New   PT LONG TERM GOAL #2   Title Report at least 50% improvement in cervical pain.  (07-08-16)   Time 8   Period Weeks   Status New   PT LONG TERM GOAL #3   Title Pt will increase cervical extension so that she is able to lie supine with </= 2 pillows with c/o minimal neck pain.  (07-08-16)   Time 8   Period Weeks   Status New   PT LONG TERM GOAL #4   Title Independent in updated HEP for cervical strengthening.  (07-08-16)   Time 8    Period Weeks   Status New        06/07/16 1454  Plan  Clinical Impression Statement Advanced strengthening ex's today wthout any issues reported. Pt is making slow, steady progress toward goals.   Pt will benefit from skilled therapeutic intervention in order to improve on the following deficits Postural dysfunction;Decreased range of motion;Decreased strength;Increased muscle spasms;Hypomobility;Pain  Rehab Potential Good  PT Frequency 3x / week  PT Duration 8 weeks  PT Treatment/Interventions ADLs/Self Care Home Management;Electrical Stimulation;Moist Heat;Ultrasound;Therapeutic exercise;Therapeutic activities;Functional mobility training;Gait training;Neuromuscular re-education;Patient/family education;Manual techniques;Passive range of motion  PT Next Visit Plan continue manual techniques to incr. cervical AROM, continue with gentle strengthening as well;modalities as needed for pain  PT Home Exercise  Plan cervical AROM - plan to add strengthening as tolerated; ADDED cervical extension prone on 05-30-16  Consulted and Agree with Plan of Care Patient      Patient will benefit from skilled therapeutic intervention in order to improve the following deficits and impairments:  Postural dysfunction, Decreased range of motion, Decreased strength, Increased muscle spasms, Hypomobility, Pain  Visit Diagnosis: Cervicalgia  Other symptoms and signs involving the musculoskeletal system  Abnormal posture  Other specified disorders of muscle  Muscle weakness (generalized)     Problem List There are no active problems to display for this patient.   Sallyanne Kuster, PTA, [facility] [address] [place], [place] [address] [phone] 06/09/2016, 8:57 AM   Name: Julia Huff MRN: [id number] Date of Birth: 09-04-1956

## 2016-06-10 NOTE — Therapy (Signed)
[facility] [address] [place], [place], [address] Phone: [phone]   Fax:  [phone]  Physical Therapy Treatment  Patient Details  Name: Julia Huff MRN: [id number] Date of Birth: 1956-05-04 Referring Provider: Dr. Catha GosselinKevin Little  Encounter Date: 06/09/2016      PT End of Session - 06/09/16 1237    Visit Number 13  G5   Number of Visits 17   Date for PT Re-Evaluation 07/07/16   Authorization Type Medicare   Authorization Time Period 05-08-16 - 07-07-16   PT Start Time 1232   PT Stop Time 1315   PT Time Calculation (min) 43 min   Activity Tolerance Patient tolerated treatment well   Behavior [facility] Therapy [facility] for tasks assessed/performed      Past Medical History  Diagnosis Date  . HTN (hypertension)   . Bipolar disorder (HCC)   . Osteopenia   . Hypothyroidism   . Peripheral edema   . 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 Cellulitis     Past Surgical History  Procedure Laterality Date  . Appendectomy  2000  . Finger surgery    . Brain surgery  2008    due to  Rt PICA aneurysm rupture    There were no vitals filed for this visit.      Subjective Assessment - 06/09/16 1237    Subjective No new complaints. No falls or pain to report.    Pertinent History aneurysm  - hospitalization Dec. 19, 2008 - Jan. 9, 2009   Diagnostic tests x - rays -- moderate arthritis   Patient Stated Goals be able to hold head up   Currently in Pain? No/denies   Pain Score 0-No pain             [facility] Adult PT Treatment/Exercise - 06/09/16 1249    Neck Exercises: Machines for Strengthening   UBE (Upper Arm Bike) level 1.0 x 2 minutes forward and 2 minute backwards with emphasis on cervical extension.   Neck Exercises: Supine   Cervical Isometrics Extension;5 secs;Limitations;10 reps   Cervical  Isometrics Limitations manual stabilization provided to maintain neutral head positoin and prevent compensatory motions, pt pushing head down into Julia Huff hands/pillow, hold for 5 sec's, 2 sets of 10 reps   Shoulder Flexion Both;10 reps;Weights   Shoulder Flexion Weights (lbs) 2   Shoulder Flexion Limitations with chin tuck: alternating UE raises x 10 each side, cues to go as high as possible in pain free ranges   Other Supine Exercise "W" with scapular retraction using 2# hand weights, 10 reps concurrent with chin tuck   Neck Exercises: Prone   Axial Exentsion 10 reps;Limitations   Axial Extension Limitations Head lifts off towel roll in neutral position, 5 sec holds x 10 reps, actively with full clearance from towel/mat surface   W Back 10 reps;Limitations   W Back Limitations "W" position, bil arm lifts together with scapular squeeze, 5 sec holds x 10 reps   Shoulder Extension 10 reps;Limitations   Shoulder Extension Limitations "aqua man", lifting arms together with 3-4 sec holds   Upper Extremity Flexion with Stabilization Flexion;10 reps;Limitations   UE Flexion with Stabilization Limitations "superman" with bil arms lifting together, 3 sec holds   Moist Heat Therapy   Number Minutes Moist Heat 10 Minutes  concurrent with e-stim   Moist Heat Location Cervical  Programme researcher, broadcasting/film/video Location premod to bil lower cervical paraspinals   Electrical Stimulation Action to decrease pain and muscle tightness, concurrent with moist hot pack   Electrical Stimulation Parameters intensity to tolerance   Electrical Stimulation Goals Pain   Manual Therapy   Manual Therapy Soft tissue mobilization;Neural Stretch;Muscle Energy Technique   Manual therapy comments began in supine position with one pillow and progressed to prone with towel roll under pt's forehead. No palpable trigger points noted today with manual therapy. Pt continues to present with tight muscles. Decreased  guarding with cues to relax.                          Soft tissue mobilization bil cervical paraspinals, upper traps, and rhomboids.   Manual Traction in supine with sustained holds x 5 reps; suboccipitial release performed as well in supine   Muscle Energy Technique to upper traps and cervical paraspinals for decreased pain, postual re-education   Neural Stretch for decreased tightness associated with cervical extension             PT Short Term Goals - 05/11/16 1733    PT SHORT TERM GOAL #1   Title Report at least 25% improvement in cervical pain with holding head upright.  (06-08-16)   Time 4   Period Weeks   Status New   PT SHORT TERM GOAL #2   Title Tolerate wearing soft cerivcal collar to assist in holding head more upright.  (06-08-16)   Time 4   Period Weeks   Status New   PT SHORT TERM GOAL #3   Title Independent in HEP for cervical strengthening and ROM - cervical retraction, rotation and cervical extension.   Time 4   Period Weeks   Status New   PT SHORT TERM GOAL #4   Title Customer service manager. cervical strength by holding head upright for at least 5" without use of cervical collar.   Time 4   Period Weeks   Status New           PT Long Term Goals - 05/13/16 1929    PT LONG TERM GOAL #1   Title Pt will demonstrate ability to hold head upright for at least 10" without use of soft cervical collar.  (07-08-16)   Time 8   Period Weeks   Status New   PT LONG TERM GOAL #2   Title Report at least 50% improvement in cervical pain.  (07-08-16)   Time 8   Period Weeks   Status New   PT LONG TERM GOAL #3   Title Pt will increase cervical extension so that she is able to lie supine with </= 2 pillows with c/o minimal neck pain.  (07-08-16)   Time 8   Period Weeks   Status New   PT LONG TERM GOAL #4   Title Independent in updated HEP for cervical strengthening.  (07-08-16)   Time 8   Period Weeks   Status New           Plan - 06/09/16 1237    Clinical  Impression Statement Today's session continued to focus more on strengthening and ending with estim/heat to decrease pain associated with strengthening exercises. Pt is making steady progress toward goals.    Rehab Potential Good   PT Frequency 3x / week   PT Duration 8 weeks   PT Treatment/Interventions ADLs/Self Care Home Management;Electrical Stimulation;Moist Heat;Ultrasound;Therapeutic exercise;Therapeutic activities;Functional mobility training;Gait  training;Neuromuscular re-education;Patient/family education;Manual techniques;Passive range of motion   PT Next Visit Plan continue manual techniques to incr. cervical AROM, continue with gentle strengthening as well;modalities as needed for pain   PT Home Exercise Plan cervical AROM - plan to add strengthening as tolerated; ADDED cervical extension prone on 05-30-16   Consulted and Agree with Plan of Care Patient      Patient will benefit from skilled therapeutic intervention in order to improve the following deficits and impairments:  Postural dysfunction, Decreased range of motion, Decreased strength, Increased muscle spasms, Hypomobility, Pain  Visit Diagnosis: Cervicalgia  Other symptoms and signs involving the musculoskeletal system  Abnormal posture  Other specified disorders of muscle  Muscle weakness (generalized)     Problem List There are no active problems to display for this patient.   Julia Huff, Julia Huff, [facility] [address] [place], [place] [address] [phone] 06/10/2016, 6:16 PM   Name: Julia Huff MRN: [id number] Date of Birth: 09/21/1956

## 2016-06-12 ENCOUNTER — Ambulatory Visit: Payer: Managed Care, Other (non HMO) | Admitting: Physical Therapy

## 2016-06-13 ENCOUNTER — Ambulatory Visit: Payer: Medicare Other | Admitting: Physical Therapy

## 2016-06-13 DIAGNOSIS — R29898 Other symptoms and signs involving the musculoskeletal system: Secondary | ICD-10-CM | POA: Diagnosis not present

## 2016-06-13 DIAGNOSIS — M6281 Muscle weakness (generalized): Secondary | ICD-10-CM | POA: Diagnosis not present

## 2016-06-13 DIAGNOSIS — R293 Abnormal posture: Secondary | ICD-10-CM | POA: Diagnosis not present

## 2016-06-13 DIAGNOSIS — M542 Cervicalgia: Secondary | ICD-10-CM

## 2016-06-13 DIAGNOSIS — M6289 Other specified disorders of muscle: Secondary | ICD-10-CM | POA: Diagnosis not present

## 2016-06-14 NOTE — Therapy (Signed)
[facility] [address] [place], [place], [address] Phone: [phone]   Fax:  [phone]  Physical Therapy Treatment  Patient Details  Name: Julia Huff MRN: [id number] Date of Birth: 1956-03-14 Referring Provider: Dr. Catha GosselinKevin Little  Encounter Date: 06/13/2016      PT End of Session - 06/14/16 0921    Visit Number 14  G6   Number of Visits 17   Date for PT Re-Evaluation 07/07/16   Authorization Type Medicare   Authorization Time Period 05-08-16 - 07-07-16   PT Start Time 0846   PT Stop Time 0932   PT Time Calculation (min) 46 min      Past Medical History  Diagnosis Date  . HTN (hypertension)   . Bipolar disorder (HCC)   . Osteopenia   . Hypothyroidism   . Peripheral edema   . 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 Cellulitis     Past Surgical History  Procedure Laterality Date  . Appendectomy  2000  . Finger surgery    . Brain surgery  2008    due to  Rt PICA aneurysm rupture    There were no vitals filed for this visit.      Subjective Assessment - 06/14/16 0912    Subjective Pt reports increasing time without use of cervical collar at home - reports she is now going longer than an hour without itt   Pertinent History aneurysm  - hospitalization Dec. 19, 2008 - Jan. 9, 2009   Diagnostic tests x - rays -- moderate arthritis   Patient Stated Goals be able to hold head up   Currently in Pain? No/denies                         [facility] Adult PT Treatment/Exercise - 06/14/16 0001    Neck Exercises: Machines for Strengthening   UBE (Upper Arm Bike) level 1.2 x 2 minutes forward and 2 minute backwards with emphasis on cervical extension.   Neck Exercises: Seated   Cervical Rotation Both;10 reps  with cues for cervical extension   Neck Exercises: Prone   Axial  Exentsion 20 reps   Axial Extension Limitations assistance given to achieve full ROM   W Back 10 reps   Other Prone Exercise 1 UE shoulder flexion with cervical extension - 5 reps each side   Other Prone Exercise "superman" lifts 5 reps 3 sec hold   Moist Heat Therapy   Number Minutes Moist Heat 10 Minutes   Moist Heat Location Cervical  concurrent with e-stim   Electrical Stimulation   Electrical Stimulation Location posterior cervical region, bil. upper trap (4 electrodes)   Electrical Stimulation Parameters 6.5 both channels - increased to tolerance   Electrical Stimulation Goals Pain  decrease tightness     Pt performed cervical extension in quadriped position - 10 reps; shoulder flexion with cervical extension with min assist For cervical extension - in quadriped position  Yellow theraband used for resisted cervical extension - 10 reps with 3 sec hold Yellow theraband for R and L cervical rotation - cues to incr. Cervical extension with rotation - 10 reps with 3 sec hold  Gave yellow theraband for HEP for cervical extension and rotation           PT Short Term Goals - 05/11/16 1733    PT  SHORT TERM GOAL #1   Title Report at least 25% improvement in cervical pain with holding head upright.  (06-08-16)   Time 4   Period Weeks   Status New   PT SHORT TERM GOAL #2   Title Tolerate wearing soft cerivcal collar to assist in holding head more upright.  (06-08-16)   Time 4   Period Weeks   Status New   PT SHORT TERM GOAL #3   Title Independent in HEP for cervical strengthening and ROM - cervical retraction, rotation and cervical extension.   Time 4   Period Weeks   Status New   PT SHORT TERM GOAL #4   Title Customer service manager. cervical strength by holding head upright for at least 5" without use of cervical collar.   Time 4   Period Weeks   Status New           PT Long Term Goals - 05/13/16 1929    PT LONG TERM GOAL #1   Title Pt will demonstrate ability to hold  head upright for at least 10" without use of soft cervical collar.  (07-08-16)   Time 8   Period Weeks   Status New   PT LONG TERM GOAL #2   Title Report at least 50% improvement in cervical pain.  (07-08-16)   Time 8   Period Weeks   Status New   PT LONG TERM GOAL #3   Title Pt will increase cervical extension so that she is able to lie supine with </= 2 pillows with c/o minimal neck pain.  (07-08-16)   Time 8   Period Weeks   Status New   PT LONG TERM GOAL #4   Title Independent in updated HEP for cervical strengthening.  (07-08-16)   Time 8   Period Weeks   Status New               Plan - 06/14/16 1610    Clinical Impression Statement Pt tolerating increased strengthening exercises with less manual therapy performed due to no trigger points palpated and pt reporting no pain at rest:  pt demonstrating increased strength in cervical extensors with improvement noted in abiltiy to hold head more upright                                                                                                                                               Rehab Potential Good   PT Frequency 3x / week   PT Duration 8 weeks   PT Treatment/Interventions ADLs/Self Care Home Management;Electrical Stimulation;Moist Heat;Ultrasound;Therapeutic exercise;Therapeutic activities;Functional mobility training;Gait training;Neuromuscular re-education;Patient/family education;Manual techniques;Passive range of motion   PT Next Visit Plan Check STG's:  continue strengthening exs for cervical musc.; modalities prn for pain/tightness; check t-band exs given for HEP   PT Home Exercise Plan cervical AROM - plan to add strengthening as tolerated; ADDED  cervical extension prone on 05-30-16;  added cervical extension and rotation with yellow theraband on 06-13-16   Consulted and Agree with Plan of Care Patient      Patient will benefit from skilled therapeutic intervention in order to improve the following deficits and  impairments:  Postural dysfunction, Decreased range of motion, Decreased strength, Increased muscle spasms, Hypomobility, Pain  Visit Diagnosis: Cervicalgia  Abnormal posture  Other symptoms and signs involving the musculoskeletal system     Problem List There are no active problems to display for this patient.   Kary Kos, PT 06/14/2016, 9:35 AM  [facility] [address] [place], [place], [address] Phone: [phone]   Fax:  [phone]  Name: Julia Huff MRN: [id number] Date of Birth: 03-May-1956

## 2016-06-15 ENCOUNTER — Encounter: Payer: Self-pay | Admitting: Physical Therapy

## 2016-06-15 ENCOUNTER — Ambulatory Visit: Payer: Medicare Other | Admitting: Physical Therapy

## 2016-06-15 DIAGNOSIS — M542 Cervicalgia: Secondary | ICD-10-CM | POA: Diagnosis not present

## 2016-06-15 DIAGNOSIS — R293 Abnormal posture: Secondary | ICD-10-CM

## 2016-06-15 DIAGNOSIS — M6281 Muscle weakness (generalized): Secondary | ICD-10-CM | POA: Diagnosis not present

## 2016-06-15 DIAGNOSIS — R29898 Other symptoms and signs involving the musculoskeletal system: Secondary | ICD-10-CM | POA: Diagnosis not present

## 2016-06-15 DIAGNOSIS — M6289 Other specified disorders of muscle: Secondary | ICD-10-CM

## 2016-06-16 NOTE — Therapy (Signed)
[facility] [address] [place], [place], [address] Phone: [phone]   Fax:  [phone]  Physical Therapy Treatment  Patient Details  Name: NAJMO PARDUE MRN: [id number] Date of Birth: 03-31-1956 Referring Provider: Dr. Hulan Fess  Encounter Date: 06/15/2016      PT End of Session - 06/15/16 1106    Visit Number 59  [place]   Number of Visits 17   Date for PT Re-Evaluation 07/07/16   Authorization Type Medicare   Authorization Time Period 05-08-16 - 07-07-16   PT Start Time 1102   PT Stop Time 1145   PT Time Calculation (min) 43 min   Activity Tolerance Patient tolerated treatment well   Behavior During Therapy [facility] for tasks assessed/performed      Past Medical History  Diagnosis Date  . HTN (hypertension)   . Bipolar disorder ([place])   . Osteopenia   . Hypothyroidism   . Peripheral edema   . 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 Cellulitis     Past Surgical History  Procedure Laterality Date  . Appendectomy  2000  . Finger surgery    . Brain surgery  2008    due to  Rt PICA aneurysm rupture    There were no vitals filed for this visit.      Subjective Assessment - 06/15/16 1104    Subjective No new complaints. No pain to report. Up to 2 hours at a time without the collar in morning and evening (while moving around house with ADL).   Pertinent History aneurysm  - hospitalization Dec. 19, 2008 - Jan. 9, 2009   Diagnostic tests x - rays -- moderate arthritis   Patient Stated Goals be able to hold head up   Currently in Pain? No/denies   Pain Score 0-No pain        06/15/16 0001  Neck Exercises: Machines for Strengthening  UBE (Upper Arm Bike) level 1.4 x 2 minutes each fwd/bwd with emphasis on tall posture and cervical extension  Neck Exercises: Standing  Upper Extremity Flexion  with Stabilization Flexion;10 reps;Limitations  UE Flexion with Stabilization Limitations rolling red ball up<>down wall while tracking ball with head to promote increased cervical extension motion and strengthening x 10 reps                                Other Standing Exercises with red ball at wall: pushups with emphasis on maintaining cervical extension  Neck Exercises: Seated  Neck Retraction 10 reps;3 secs;Limitations  Neck Retraction Limitations using yellow band for resistance. cues needed to hold band anchor stable and not let it move as well                 Lateral Flexion Both;10 reps;Limitations  Lateral Flexion Limitations yellow band resistance, cues on form, hold times and to keep band anchor stable  Neck Exercises: Prone  Axial Exentsion 20 reps  Axial Extension Limitations cues to lift head as high as possible, AROM pt was able to clear towel roll with each lift.  W Back 10 reps;Limitations  W Back Limitations "W" position, bil arm lifts together with scapular squeeze, 5 sec holds x 10 reps  Shoulder Extension 10 reps;Limitations  Shoulder Extension Limitations "aqua man" lifting arms together, 5 sec holds x 10 reps  Upper Extremity Flexion with Stabilization Flexion;10 reps;Limitations  UE Flexion with Stabilization Limitations "superman" with bil arms lifting together, 3 sec holds  Moist Heat Therapy  Number Minutes Moist Heat 10 Minutes  Moist Heat Location Cervical (concurrent with e-stim)  Electrical Stimulation  Electrical Stimulation Location posterior cervical region, bil. upper trap (4 electrodes)  Electrical Stimulation Action IFC for decreased pain  Electrical Stimulation Parameters intensity to tolerance  Electrical Stimulation Goals Pain;Other (comment) (decrease muscle tightness concurrent with moist heat)          PT Short Term Goals - 06/15/16 1106    PT SHORT TERM GOAL #1   Title Report at least 25% improvement in cervical pain with holding head  upright.  (06-08-16)   Baseline 06/15/16: pt now reporting no pain at rest, does get occasional pian on right side with increased cervical extension   Status Achieved   PT SHORT TERM GOAL #2   Title Tolerate wearing soft cerivcal collar to assist in holding head more upright.  (06-08-16)   Baseline 06/15/16: has been wearing since eval. has increased the size collar she is wearing to 4 inches and is actively weaning out of it.   Status Achieved   PT SHORT TERM GOAL #3   Title Independent in HEP for cervical strengthening and ROM - cervical retraction, rotation and cervical extension.   Baseline 06/15/16: met with HEP issued to date.   Period Weeks   Status Achieved   PT SHORT TERM GOAL #4   Title Demonstrate incr. cervical strength by holding head upright for at least 5" without use of cervical collar.   Baseline met on 06/15/16 in session.   Time --   Period --   Status Achieved           PT Long Term Goals - 05/13/16 1929    PT LONG TERM GOAL #1   Title Pt will demonstrate ability to hold head upright for at least 10" without use of soft cervical collar.  (07-08-16)   Time 8   Period Weeks   Status New   PT LONG TERM GOAL #2   Title Report at least 50% improvement in cervical pain.  (07-08-16)   Time 8   Period Weeks   Status New   PT LONG TERM GOAL #3   Title Pt will increase cervical extension so that she is able to lie supine with </= 2 pillows with c/o minimal neck pain.  (07-08-16)   Time 8   Period Weeks   Status New   PT LONG TERM GOAL #4   Title Independent in updated HEP for cervical strengthening.  (07-08-16)   Time 8   Period Weeks   Status New            Plan - 06/15/16 1106    Clinical Impression Statement All STGs met today. Continued to focus more on strengthening with exercises, advancing them this session as tolerated. Pt reporting discomfort/pain only with increased cervical extension duirng exercises. Ended with modalities to address this. Pt is  progressing toward her LTGs as well.   Rehab Potential Good   PT Frequency 3x / week   PT Duration 8 weeks   PT Treatment/Interventions ADLs/Self Care Home Management;Electrical Stimulation;Moist Heat;Ultrasound;Therapeutic exercise;Therapeutic activities;Functional mobility training;Gait training;Neuromuscular re-education;Patient/family education;Manual techniques;Passive range of motion   PT Next Visit Plan continue strengthening exs for cervical musc.; modalities prn for pain/tightness; continue to advance HEP as needed for strengthening   PT Home Exercise Plan cervical  AROM - plan to add strengthening as tolerated; ADDED cervical extension prone on 05-30-16;  added cervical extension and rotation with yellow theraband on 06-13-16   Consulted and Agree with Plan of Care Patient      Patient will benefit from skilled therapeutic intervention in order to improve the following deficits and impairments:  Postural dysfunction, Decreased range of motion, Decreased strength, Increased muscle spasms, Hypomobility, Pain  Visit Diagnosis: Cervicalgia  Abnormal posture  Other symptoms and signs involving the musculoskeletal system  Other specified disorders of muscle  Muscle weakness (generalized)     Problem List There are no active problems to display for this patient.   Willow Ora, PTA, [facility] [address] [place], [place] [address] [phone] 06/16/2016, 10:36 AM   Name: YAEKO FAZEKAS MRN: [id number] Date of Birth: June 04, 1956

## 2016-06-19 ENCOUNTER — Ambulatory Visit: Payer: Medicare Other | Admitting: Physical Therapy

## 2016-06-19 DIAGNOSIS — R29898 Other symptoms and signs involving the musculoskeletal system: Secondary | ICD-10-CM | POA: Diagnosis not present

## 2016-06-19 DIAGNOSIS — M6289 Other specified disorders of muscle: Secondary | ICD-10-CM | POA: Diagnosis not present

## 2016-06-19 DIAGNOSIS — M542 Cervicalgia: Secondary | ICD-10-CM | POA: Diagnosis not present

## 2016-06-19 DIAGNOSIS — R293 Abnormal posture: Secondary | ICD-10-CM | POA: Diagnosis not present

## 2016-06-19 DIAGNOSIS — M6281 Muscle weakness (generalized): Secondary | ICD-10-CM | POA: Diagnosis not present

## 2016-06-20 ENCOUNTER — Encounter: Payer: Self-pay | Admitting: Physical Therapy

## 2016-06-20 NOTE — Therapy (Signed)
[facility] [address] [place], [place], [address] Phone: [phone]   Fax:  [phone]  Physical Therapy Treatment  Patient Details  Name: Julia Huff MRN: [id number] Date of Birth: 1956-08-12 Referring Provider: Dr. Hulan Fess  Encounter Date: 06/19/2016      PT End of Session - 06/20/16 1150    Visit Number 16  G8   Number of Visits 20   Date for PT Re-Evaluation 07/07/16   Authorization Time Period 05-08-16 - 07-07-16   PT Start Time 1225   PT Stop Time 1231   PT Time Calculation (min) 46 min      Past Medical History  Diagnosis Date  . HTN (hypertension)   . Bipolar disorder ([place])   . Osteopenia   . Hypothyroidism   . Peripheral edema   . 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 Cellulitis     Past Surgical History  Procedure Laterality Date  . Appendectomy  2000  . Finger surgery    . Brain surgery  2008    due to  Rt PICA aneurysm rupture    There were no vitals filed for this visit.      Subjective Assessment - 06/20/16 1058    Subjective Pt reports she is only wearing collar at home when she gets tired - able to go about 2 hours without wearing collar   Pertinent History aneurysm  - hospitalization Dec. 19, 2008 - Jan. 9, 2009   Diagnostic tests x - rays -- moderate arthritis   Patient Stated Goals be able to hold head up   Currently in Pain? No/denies                         [facility] Adult PT Treatment/Exercise - 06/20/16 1103    Neck Exercises: Machines for Strengthening   UBE (Upper Arm Bike) 2 1/2" forward and then backward - level 1.5   Neck Exercises: Standing   Neck Retraction 20 reps  pt sitting - towel roll behind head   Other Standing Exercises Rolling blue ball up wall for UE flexion with cervical extension   Neck Exercises: Prone   Axial  Exentsion 20 reps   Axial Extension Limitations cues to lift head as high as possible, AROM pt was able to clear towel roll with each lift.   W Back 10 reps;Limitations   Shoulder Extension 10 reps   Other Prone Exercise 1 UE shoulder flexion with cervical extension - 5 reps each side   Other Prone Exercise "superman" lifts 5 reps 3 sec hold   Moist Heat Therapy   Number Minutes Moist Heat 15 Minutes   Moist Heat Location Cervical  concurrent with e-stim   Electrical Stimulation   Electrical Stimulation Location posterior cervical region, bil. upper trap (4 electrodes)   Electrical Stimulation Action IFC for decr. pain   Electrical Stimulation Parameters 8.0 v   Electrical Stimulation Goals Pain;Other (comment)     Quadriped position - lifting head up for cervical extension strengthening x 10 reps Lifting 1 arm up for shoulder flexion with cervical extension x 3 reps each side  Prone on forearms - cervical extension x 5 reps; then with elbows extended as able to perform with cervical extension with min assist To achieve full cervical ROM  PT Short Term Goals - 06/15/16 1106    PT SHORT TERM GOAL #1   Title Report at least 25% improvement in cervical pain with holding head upright.  (06-08-16)   Baseline 06/15/16: pt now reporting no pain at rest, does get occasional pian on right side with increased cervical extension   Status Achieved   PT SHORT TERM GOAL #2   Title Tolerate wearing soft cerivcal collar to assist in holding head more upright.  (06-08-16)   Baseline 06/15/16: has been wearing since eval. has increased the size collar she is wearing to 4 inches and is actively weaning out of it.   Status Achieved   PT SHORT TERM GOAL #3   Title Independent in HEP for cervical strengthening and ROM - cervical retraction, rotation and cervical extension.   Baseline 06/15/16: met with HEP issued to date.   Period Weeks   Status Achieved   PT SHORT TERM GOAL #4   Title  Demonstrate incr. cervical strength by holding head upright for at least 5" without use of cervical collar.   Baseline met on 06/15/16 in session.   Time --   Period --   Status Achieved           PT Long Term Goals - 05/13/16 1929    PT LONG TERM GOAL #1   Title Pt will demonstrate ability to hold head upright for at least 10" without use of soft cervical collar.  (07-08-16)   Time 8   Period Weeks   Status New   PT LONG TERM GOAL #2   Title Report at least 50% improvement in cervical pain.  (07-08-16)   Time 8   Period Weeks   Status New   PT LONG TERM GOAL #3   Title Pt will increase cervical extension so that she is able to lie supine with </= 2 pillows with c/o minimal neck pain.  (07-08-16)   Time 8   Period Weeks   Status New   PT LONG TERM GOAL #4   Title Independent in updated HEP for cervical strengthening.  (07-08-16)   Time 8   Period Weeks   Status New               Plan - 06/20/16 1152    Clinical Impression Statement Pt progressing well towards goals - demonstrating incr. strength in cervical musc. with decr. muscle endurance, however, this is improving significantly compared to initial evaluation; pt able to extend head in prone but does need assistance for final 1/3 of AROM due to weakness                                                                      Rehab Potential Good   PT Frequency 2x / week   PT Duration 8 weeks   PT Treatment/Interventions ADLs/Self Care Home Management;Electrical Stimulation;Moist Heat;Ultrasound;Therapeutic exercise;Therapeutic activities;Functional mobility training;Gait training;Neuromuscular re-education;Patient/family education;Manual techniques;Passive range of motion   PT Next Visit Plan continue strengthening exs for cervical musc.; modalities prn for pain/tightness; continue to advance HEP as needed for strengthening   PT Home Exercise Plan cervical AROM - plan to add strengthening as tolerated; ADDED cervical  extension prone on 05-30-16;  added cervical extension and rotation with  yellow theraband on 06-13-16   Consulted and Agree with Plan of Care Patient      Patient will benefit from skilled therapeutic intervention in order to improve the following deficits and impairments:  Postural dysfunction, Decreased range of motion, Decreased strength, Increased muscle spasms, Hypomobility, Pain  Visit Diagnosis: Abnormal posture  Muscle weakness (generalized)     Problem List There are no active problems to display for this patient.   Alda Lea, PT 06/20/2016, 12:05 PM  [facility] [address] [place], [place], [address] Phone: [phone]   Fax:  [phone]  Name: Julia Huff MRN: [id number] Date of Birth: 09-15-1956

## 2016-06-21 ENCOUNTER — Ambulatory Visit: Payer: Medicare Other | Admitting: Physical Therapy

## 2016-06-21 ENCOUNTER — Encounter: Payer: Self-pay | Admitting: Physical Therapy

## 2016-06-21 DIAGNOSIS — R293 Abnormal posture: Secondary | ICD-10-CM

## 2016-06-21 DIAGNOSIS — R29898 Other symptoms and signs involving the musculoskeletal system: Secondary | ICD-10-CM

## 2016-06-21 DIAGNOSIS — M6289 Other specified disorders of muscle: Secondary | ICD-10-CM | POA: Diagnosis not present

## 2016-06-21 DIAGNOSIS — M6281 Muscle weakness (generalized): Secondary | ICD-10-CM | POA: Diagnosis not present

## 2016-06-21 DIAGNOSIS — M542 Cervicalgia: Secondary | ICD-10-CM | POA: Diagnosis not present

## 2016-06-21 NOTE — Therapy (Signed)
[facility] [address] [place], [place], [address] Phone: [phone]   Fax:  [phone]  Physical Therapy Treatment  Patient Details  Name: Julia Huff MRN: [id number] Date of Birth: 1956-04-15 Referring Provider: Dr. Hulan Fess  Encounter Date: 06/21/2016      PT End of Session - 06/21/16 1237    Visit Number 17  G9   Number of Visits 20   Date for PT Re-Evaluation 07/07/16   Authorization Type Medicare   Authorization Time Period 05-08-16 - 07-07-16   PT Start Time 1233   PT Stop Time 1322   PT Time Calculation (min) 49 min      Past Medical History  Diagnosis Date  . HTN (hypertension)   . Bipolar disorder ([place])   . Osteopenia   . Hypothyroidism   . Peripheral edema   . 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 Cellulitis     Past Surgical History  Procedure Laterality Date  . Appendectomy  2000  . Finger surgery    . Brain surgery  2008    due to  Rt PICA aneurysm rupture    There were no vitals filed for this visit.      Subjective Assessment - 06/21/16 1236    Subjective No new complaints. Going for 2 hours spans without collar on.   Pertinent History aneurysm  - hospitalization Dec. 19, 2008 - Jan. 9, 2009   Diagnostic tests x - rays -- moderate arthritis   Patient Stated Goals be able to hold head up   Pain Score 0-No pain            [facility] Adult PT Treatment/Exercise - 06/21/16 1238    Neck Exercises: Machines for Strengthening   UBE (Upper Arm Bike) level 1.5 x 3 minutes each fwd/bwd   Neck Exercises: Standing   Upper Extremity Flexion with Stabilization Flexion;10 reps;Limitations   UE Flexion with Stabilization Limitations rolling red ball up<>down wall while tracking ball with head to promote increased cervical extension motion and strengthening x 10 reps                                  Neck Exercises: Prone   Axial Exentsion 20 reps   Axial Extension Limitations cues to lift head as high as possible, AROM pt was able to clear towel roll with each lift.   Shoulder Extension 15 reps;Limitations   Shoulder Extension Limitations "aqua man" lifting both arms together, 5 sec hold x 10 reps   Upper Extremity Flexion with Stabilization Flexion;10 reps;Limitations   UE Flexion with Stabilization Limitations "superman" with bil arms lifting together, 5 sec holds   Other Prone Exercise Quadruped position: cervical extension x 10 reps; holding cervical spine in neutral, alternating UE lifts x 5 each side. cues on form and technique needed.   Moist Heat Therapy   Number Minutes Moist Heat 15 Minutes   Moist Heat Location Cervical  concurrent with IFC estim   Electrical Stimulation   Electrical Stimulation Location posterior cervical region, bil. upper trap (4 electrodes)   Electrical Stimulation Action IFC for decreased pain and muscle tightness   Electrical Stimulation Parameters intensity to tolerance   Electrical Stimulation Goals Pain;Other (comment)  muscle tightness  PT Short Term Goals - 06/15/16 1106    PT SHORT TERM GOAL #1   Title Report at least 25% improvement in cervical pain with holding head upright.  (06-08-16)   Baseline 06/15/16: pt now reporting no pain at rest, does get occasional pian on right side with increased cervical extension   Status Achieved   PT SHORT TERM GOAL #2   Title Tolerate wearing soft cerivcal collar to assist in holding head more upright.  (06-08-16)   Baseline 06/15/16: has been wearing since eval. has increased the size collar she is wearing to 4 inches and is actively weaning out of it.   Status Achieved   PT SHORT TERM GOAL #3   Title Independent in HEP for cervical strengthening and ROM - cervical retraction, rotation and cervical extension.   Baseline 06/15/16: met with HEP issued to date.    Period Weeks   Status Achieved   PT SHORT TERM GOAL #4   Title Demonstrate incr. cervical strength by holding head upright for at least 5" without use of cervical collar.   Baseline met on 06/15/16 in session.   Time --   Period --   Status Achieved           PT Long Term Goals - 05/13/16 1929    PT LONG TERM GOAL #1   Title Pt will demonstrate ability to hold head upright for at least 10" without use of soft cervical collar.  (07-08-16)   Time 8   Period Weeks   Status New   PT LONG TERM GOAL #2   Title Report at least 50% improvement in cervical pain.  (07-08-16)   Time 8   Period Weeks   Status New   PT LONG TERM GOAL #3   Title Pt will increase cervical extension so that she is able to lie supine with </= 2 pillows with c/o minimal neck pain.  (07-08-16)   Time 8   Period Weeks   Status New   PT LONG TERM GOAL #4   Title Independent in updated HEP for cervical strengthening.  (07-08-16)   Time 8   Period Weeks   Status New             Plan - 06/21/16 1237    Clinical Impression Statement Today's session continued to focus on strengthening and postural awareness without any issues reported. Was able to advance ex's today without any issues noted. Pt does demo cervical/thoracic weakness with visable muscle tremors noted with exercises. Pt is making steady progress toward goals.                         Rehab Potential Good   PT Frequency 2x / week   PT Duration 8 weeks   PT Treatment/Interventions ADLs/Self Care Home Management;Electrical Stimulation;Moist Heat;Ultrasound;Therapeutic exercise;Therapeutic activities;Functional mobility training;Gait training;Neuromuscular re-education;Patient/family education;Manual techniques;Passive range of motion   PT Next Visit Plan G-code next visit; continue strengthening exs for cervical musc.; modalities prn for pain/tightness; continue to advance HEP as needed for strengthening   PT Home Exercise Plan cervical AROM - plan to add  strengthening as tolerated; ADDED cervical extension prone on 05-30-16;  added cervical extension and rotation with yellow theraband on 06-13-16   Consulted and Agree with Plan of Care Patient      Patient will benefit from skilled therapeutic intervention in order to improve the following deficits and impairments:  Postural dysfunction, Decreased range of motion, Decreased strength, Increased  muscle spasms, Hypomobility, Pain  Visit Diagnosis: Abnormal posture  Muscle weakness (generalized)  Cervicalgia  Other symptoms and signs involving the musculoskeletal system  Other specified disorders of muscle     Problem List There are no active problems to display for this patient.   Willow Ora, PTA, [facility] [address] [place], [place] [address] [phone] 06/22/2016, 6:28 PM   Name: Julia Huff MRN: [id number] Date of Birth: 1956-08-29

## 2016-06-22 ENCOUNTER — Ambulatory Visit: Payer: Managed Care, Other (non HMO) | Admitting: Physical Therapy

## 2016-06-23 ENCOUNTER — Ambulatory Visit: Payer: Managed Care, Other (non HMO) | Admitting: Physical Therapy

## 2016-06-23 DIAGNOSIS — F251 Schizoaffective disorder, depressive type: Secondary | ICD-10-CM | POA: Diagnosis not present

## 2016-06-26 ENCOUNTER — Ambulatory Visit: Payer: Medicare Other | Attending: Family Medicine | Admitting: Physical Therapy

## 2016-06-26 DIAGNOSIS — M6289 Other specified disorders of muscle: Secondary | ICD-10-CM | POA: Insufficient documentation

## 2016-06-26 DIAGNOSIS — M6281 Muscle weakness (generalized): Secondary | ICD-10-CM | POA: Diagnosis not present

## 2016-06-26 DIAGNOSIS — R293 Abnormal posture: Secondary | ICD-10-CM

## 2016-06-26 DIAGNOSIS — M542 Cervicalgia: Secondary | ICD-10-CM | POA: Diagnosis not present

## 2016-06-26 DIAGNOSIS — R29898 Other symptoms and signs involving the musculoskeletal system: Secondary | ICD-10-CM | POA: Diagnosis not present

## 2016-06-27 NOTE — Therapy (Signed)
[facility] [address] [place], [place], [address] Phone: [phone]   Fax:  [phone]  Physical Therapy Treatment  Patient Details  Name: Julia Huff MRN: [id number] Date of Birth: 05/31/1956 Referring Provider: Dr. Hulan Fess  Encounter Date: 06/26/2016      PT End of Session - 06/27/16 0718    Visit Number 18  G10   Number of Visits 21   Date for PT Re-Evaluation 07/07/16   Authorization Type Medicare   Authorization Time Period 05-08-16 - 07-07-16   PT Start Time 1122   PT Stop Time 1220   PT Time Calculation (min) 58 min      Past Medical History  Diagnosis Date  . HTN (hypertension)   . Bipolar disorder ([place])   . Osteopenia   . Hypothyroidism   . Peripheral edema   . 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 Cellulitis     Past Surgical History  Procedure Laterality Date  . Appendectomy  2000  . Finger surgery    . Brain surgery  2008    due to  Rt PICA aneurysm rupture    There were no vitals filed for this visit.      Subjective Assessment - 06/27/16 0709    Subjective Pt states she went without collar for most of day yesterday but "paid for it with R shoulder pain increased"   Pertinent History aneurysm  - hospitalization Dec. 19, 2008 - Jan. 9, 2009   Diagnostic tests x - rays -- moderate arthritis   Patient Stated Goals be able to hold head up   Currently in Pain? Yes   Pain Score 3    Pain Location Neck   Pain Orientation Right   Pain Descriptors / Indicators Aching;Sore   Pain Type Chronic pain   Pain Onset More than a month ago   Pain Frequency Intermittent                         [facility] Adult PT Treatment/Exercise - 06/27/16 0001    Neck Exercises: Machines for Strengthening   UBE (Upper Arm Bike) level 1.5 x 3 minutes each fwd/bwd   Neck  Exercises: Seated   Neck Retraction 10 reps;3 secs   Other Seated Exercise scapular retraction with green theraband - elbows flexed at 90 degrees - pulling staright back; elbows extended - scapular retraction x 10 reps; diagonals each direction with green theraband with cues to hold head erect - 5 reps each   Neck Exercises: Prone   Axial Exentsion 10 reps   W Back 10 reps;Limitations   UE Flexion with Stabilization Limitations "superman" with bil arms lifting together, 5 sec holds   Other Prone Exercise Quadruped position: cervical extension x 10 reps; holding cervical spine in neutral, alternating UE lifts x 5 each side. cues on form and technique needed.      Ultrasound - 1.5 w/cm2 50% pulsed to R upper trap, middle trap and R posterior cervical region x 8"  E- stim - IFC - to posterior cervical and upper trap regions - 7 v intensity - x 20" with moist heat to same area with goal  For pain reduction            PT Short Term Goals - 06/15/16 1106    PT SHORT TERM GOAL #1  Title Report at least 25% improvement in cervical pain with holding head upright.  (06-08-16)   Baseline 06/15/16: pt now reporting no pain at rest, does get occasional pian on right side with increased cervical extension   Status Achieved   PT SHORT TERM GOAL #2   Title Tolerate wearing soft cerivcal collar to assist in holding head more upright.  (06-08-16)   Baseline 06/15/16: has been wearing since eval. has increased the size collar she is wearing to 4 inches and is actively weaning out of it.   Status Achieved   PT SHORT TERM GOAL #3   Title Independent in HEP for cervical strengthening and ROM - cervical retraction, rotation and cervical extension.   Baseline 06/15/16: met with HEP issued to date.   Period Weeks   Status Achieved   PT SHORT TERM GOAL #4   Title Demonstrate incr. cervical strength by holding head upright for at least 5" without use of cervical collar.   Baseline met on 06/15/16 in session.    Time --   Period --   Status Achieved           PT Long Term Goals - 05/13/16 1929    PT LONG TERM GOAL #1   Title Pt will demonstrate ability to hold head upright for at least 10" without use of soft cervical collar.  (07-08-16)   Time 8   Period Weeks   Status New   PT LONG TERM GOAL #2   Title Report at least 50% improvement in cervical pain.  (07-08-16)   Time 8   Period Weeks   Status New   PT LONG TERM GOAL #3   Title Pt will increase cervical extension so that she is able to lie supine with </= 2 pillows with c/o minimal neck pain.  (07-08-16)   Time 8   Period Weeks   Status New   PT LONG TERM GOAL #4   Title Independent in updated HEP for cervical strengthening.  (07-08-16)   Time 8   Period Weeks   Status New               Plan - 06/27/16 0719    Clinical Impression Statement Pt continues to progress towards goals and is improving with holding head upright for longer periods of time; pt does cont to c/o R sided shoulder/neck pain but reports it is intermittent in occurrence   Rehab Potential Good   PT Frequency 2x / week   PT Duration 8 weeks   PT Treatment/Interventions ADLs/Self Care Home Management;Electrical Stimulation;Moist Heat;Ultrasound;Therapeutic exercise;Therapeutic activities;Functional mobility training;Gait training;Neuromuscular re-education;Patient/family education;Manual techniques;Passive range of motion   PT Next Visit Plan strengthening exs:  modalities for cervical pain   PT Home Exercise Plan cervical AROM - plan to add strengthening as tolerated; ADDED cervical extension prone on 05-30-16;  added cervical extension and rotation with yellow theraband on 06-13-16   Consulted and Agree with Plan of Care Patient      Patient will benefit from skilled therapeutic intervention in order to improve the following deficits and impairments:  Postural dysfunction, Decreased range of motion, Decreased strength, Increased muscle spasms,  Hypomobility, Pain  Visit Diagnosis: Cervicalgia  Abnormal posture  Muscle weakness (generalized)       G-Codes - 18-Jul-2016 0724    Functional Assessment Tool Used Pt has progressed to wearing 4" cervical collar (from 2" collar initially); pt able to hold head upright for longer periods of time with less c/o pain  Functional Limitation Changing and maintaining body position   Changing and Maintaining Body Position Current Status [phone]) At least 40 percent but less than 60 percent impaired, limited or restricted   Changing and Maintaining Body Position Goal Status ([id number]) At least 20 percent but less than 40 percent impaired, limited or restricted      Problem List There are no active problems to display for this patient.   Physical Therapy Progress Note  Dates of Reporting Period: 05-31-16 to 06-26-16  Objective Reports of Subjective Statement: Pt demonstrating ability to hold head upright for longer periods of time; pt reports she did not wear collar for most of the day on Sunday, 06-25-16  Objective Measurements: Pt 's cervical extensor strength has increased from 2-/5 to 3-/5; pt cont to have decr. Muscle endurance of cervical musc.with cont c/o pain in R shoulder/cervical musc.;  Pt has progressed with cervical extension from 2" collar to tolerating a 4" soft cervical collar to assist in holding head upright  Goal Update:  See LTG's above - pt is progressing well towards these goals  Plan: Cont with strengthening and ROM exercises; modalities for cervical pain and tightness including [place], e-stim and moist heat; HEP and pt education  Reason Skilled Services are Required: Decr. Cervical musc. Strength with pt unable to hold head upright: cont. C/o pain in cervical musc which varies in intensity and occurrence;    Ramesh Moan, [place], PT 06/27/2016, 7:35 AM  [facility] [address] [place], [place], [address] Phone:  [phone]   Fax:  [phone]  Name: Julia Huff MRN: [id number] Date of Birth: October 11, 1956

## 2016-06-28 ENCOUNTER — Ambulatory Visit: Payer: Medicare Other | Admitting: Physical Therapy

## 2016-06-28 DIAGNOSIS — M6281 Muscle weakness (generalized): Secondary | ICD-10-CM | POA: Diagnosis not present

## 2016-06-28 DIAGNOSIS — M542 Cervicalgia: Secondary | ICD-10-CM | POA: Diagnosis not present

## 2016-06-28 DIAGNOSIS — R293 Abnormal posture: Secondary | ICD-10-CM

## 2016-06-28 DIAGNOSIS — M6289 Other specified disorders of muscle: Secondary | ICD-10-CM | POA: Diagnosis not present

## 2016-06-28 DIAGNOSIS — R29898 Other symptoms and signs involving the musculoskeletal system: Secondary | ICD-10-CM | POA: Diagnosis not present

## 2016-06-28 NOTE — Therapy (Signed)
[facility] [address] [place], [place], [address] Phone: [phone]   Fax:  [phone]  Physical Therapy Treatment  Patient Details  Name: Julia Huff MRN: [id number] Date of Birth: 1956/09/27 Referring Provider: Dr. Hulan Fess  Encounter Date: 06/28/2016      PT End of Session - 06/28/16 2246    Visit Number 19  G2   Number of Visits 22   Date for PT Re-Evaluation 07/07/16   Authorization Type Medicare   Authorization Time Period 05-08-16 - 07-07-16   PT Start Time 1150   PT Stop Time 1237   PT Time Calculation (min) 47 min      Past Medical History  Diagnosis Date  . HTN (hypertension)   . Bipolar disorder ([place])   . Osteopenia   . Hypothyroidism   . Peripheral edema   . 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 Cellulitis     Past Surgical History  Procedure Laterality Date  . Appendectomy  2000  . Finger surgery    . Brain surgery  2008    due to  Rt PICA aneurysm rupture    There were no vitals filed for this visit.      Subjective Assessment - 06/28/16 2239    Subjective Pt reports she is doing better - cont to decrease wear time of cervical collar at home   Pertinent History aneurysm  - hospitalization Dec. 19, 2008 - Jan. 9, 2009   Diagnostic tests x - rays -- moderate arthritis   Patient Stated Goals be able to hold head up   Currently in Pain? Yes   Pain Score 3    Pain Location Neck   Pain Orientation Right   Pain Descriptors / Indicators Aching;Sore   Pain Type Chronic pain   Pain Onset More than a month ago   Pain Frequency Intermittent                         [facility] Adult PT Treatment/Exercise - 06/28/16 1210    Neck Exercises: Machines for Strengthening   UBE (Upper Arm Bike) level 1.5 x 3 minutes each fwd/bwd   Neck Exercises: Standing   Neck Retraction 10 reps   UE Flexion with Stabilization Limitations rolling red ball up<>down wall while tracking ball with head to promote increased cervical extension motion and strengthening x 10 reps                                 Neck Exercises: Seated   Neck Retraction 10 reps;3 secs   Neck Exercises: Prone   Axial Exentsion 10 reps   Axial Extension Limitations cues to lift head as high as possible, AROM pt was able to clear towel roll with each lift.   W Back 10 reps;Limitations   Upper Extremity Flexion with Stabilization Flexion;10 reps;Limitations   Other Prone Exercise Quadruped position: cervical extension x 10 reps; holding cervical spine in neutral, alternating UE lifts x 5 each side. cues on form and technique needed.   Moist Heat Therapy   Moist Heat Location Cervical  concurrent with IFC estim   Electrical Stimulation   Electrical Stimulation Location posterior cervical region, bil. upper trap (4 electrodes)   Electrical Stimulation Action IFC for decr. pain   Electrical  Stimulation Parameters 8 v  15"   Electrical Stimulation Goals Pain;Other (comment)  muscle tightness     Pt performed scapular retraction / upper body strengthening - standing pull down exercise (shoulder extension with elbows extended) on Para-gym equipment with 10# weight Standing - lat. Pull 10# 2 sets 10 reps  Seated vertical row for mid trap'scapular retraction strengthening 3 sets of 10 reps with 10# on para-gym equipment             PT Short Term Goals - 06/15/16 1106    PT SHORT TERM GOAL #1   Title Report at least 25% improvement in cervical pain with holding head upright.  (06-08-16)   Baseline 06/15/16: pt now reporting no pain at rest, does get occasional pian on right side with increased cervical extension   Status Achieved   PT SHORT TERM GOAL #2   Title Tolerate wearing soft cerivcal collar to assist in holding head more upright.  (06-08-16)   Baseline 06/15/16: has been  wearing since eval. has increased the size collar she is wearing to 4 inches and is actively weaning out of it.   Status Achieved   PT SHORT TERM GOAL #3   Title Independent in HEP for cervical strengthening and ROM - cervical retraction, rotation and cervical extension.   Baseline 06/15/16: met with HEP issued to date.   Period Weeks   Status Achieved   PT SHORT TERM GOAL #4   Title Demonstrate incr. cervical strength by holding head upright for at least 5" without use of cervical collar.   Baseline met on 06/15/16 in session.   Time --   Period --   Status Achieved           PT Long Term Goals - 05/13/16 1929    PT LONG TERM GOAL #1   Title Pt will demonstrate ability to hold head upright for at least 10" without use of soft cervical collar.  (07-08-16)   Time 8   Period Weeks   Status New   PT LONG TERM GOAL #2   Title Report at least 50% improvement in cervical pain.  (07-08-16)   Time 8   Period Weeks   Status New   PT LONG TERM GOAL #3   Title Pt will increase cervical extension so that she is able to lie supine with </= 2 pillows with c/o minimal neck pain.  (07-08-16)   Time 8   Period Weeks   Status New   PT LONG TERM GOAL #4   Title Independent in updated HEP for cervical strengthening.  (07-08-16)   Time 8   Period Weeks   Status New               Plan - 06/28/16 2248    Clinical Impression Statement Pt progressing well towards goals - demonstrating incr. cervical extensor strength by ability to hold head upright for longer periods of time   Rehab Potential Good   PT Frequency 2x / week   PT Duration 8 weeks   PT Treatment/Interventions ADLs/Self Care Home Management;Electrical Stimulation;Moist Heat;Ultrasound;Therapeutic exercise;Therapeutic activities;Functional mobility training;Gait training;Neuromuscular re-education;Patient/family education;Manual techniques;Passive range of motion   PT Next Visit Plan strengthening exs:  modalities for cervical  pain   PT Home Exercise Plan cervical AROM - plan to add strengthening as tolerated; ADDED cervical extension prone on 05-30-16;  added cervical extension and rotation with yellow theraband on 06-13-16   Consulted and Agree with Plan of Care Patient  Patient will benefit from skilled therapeutic intervention in order to improve the following deficits and impairments:  Postural dysfunction, Decreased range of motion, Decreased strength, Increased muscle spasms, Hypomobility, Pain  Visit Diagnosis: Muscle weakness (generalized)  Abnormal posture  Cervicalgia     Problem List There are no active problems to display for this patient.   Alda Lea, PT 06/28/2016, 10:57 PM  [facility] [address] [place], [place], [address] Phone: [phone]   Fax:  [phone]  Name: Julia Huff MRN: [id number] Date of Birth: 1956/01/28

## 2016-07-06 ENCOUNTER — Ambulatory Visit: Payer: Medicare Other | Admitting: Physical Therapy

## 2016-07-06 ENCOUNTER — Encounter: Payer: Self-pay | Admitting: Physical Therapy

## 2016-07-06 DIAGNOSIS — R293 Abnormal posture: Secondary | ICD-10-CM | POA: Diagnosis not present

## 2016-07-06 DIAGNOSIS — R29898 Other symptoms and signs involving the musculoskeletal system: Secondary | ICD-10-CM

## 2016-07-06 DIAGNOSIS — M542 Cervicalgia: Secondary | ICD-10-CM

## 2016-07-06 DIAGNOSIS — M6289 Other specified disorders of muscle: Secondary | ICD-10-CM | POA: Diagnosis not present

## 2016-07-06 DIAGNOSIS — M6281 Muscle weakness (generalized): Secondary | ICD-10-CM

## 2016-07-06 NOTE — Therapy (Signed)
[facility] [address] [place], [place], [address] Phone: [phone]   Fax:  [phone]  Physical Therapy Treatment  Patient Details  Name: Julia Huff MRN: [id number] Date of Birth: 1956/07/09 Referring Provider: Dr. Hulan Fess  Encounter Date: 07/06/2016      PT End of Session - 07/06/16 0936    Visit Number 20   Number of Visits 22   Date for PT Re-Evaluation 07/07/16   Authorization Type Medicare   Authorization Time Period 05-08-16 - 07-07-16   PT Start Time 0931   PT Stop Time 1015   PT Time Calculation (min) 44 min   Activity Tolerance Patient tolerated treatment well   Behavior During Therapy [facility] for tasks assessed/performed      Past Medical History  Diagnosis Date  . HTN (hypertension)   . Bipolar disorder ([place])   . Osteopenia   . Hypothyroidism   . Peripheral edema   . 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 Cellulitis     Past Surgical History  Procedure Laterality Date  . Appendectomy  2000  . Finger surgery    . Brain surgery  2008    due to  Rt PICA aneurysm rupture    There were no vitals filed for this visit.      Subjective Assessment - 07/06/16 0934    Subjective Pt reports that she is continuing to do better by decreasing her wear time of the cervical collar. She stated that she sometimes removes it for a couple hours. No pain to report. No new complaints.   Pertinent History aneurysm  - hospitalization Dec. 19, 2008 - Jan. 9, 2009   Diagnostic tests x - rays -- moderate arthritis   Patient Stated Goals be able to hold head up   Currently in Pain? No/denies   Pain Score 0-No pain          [facility] Adult PT Treatment/Exercise - 07/06/16 0931    Neck Exercises: Machines for Strengthening   UBE (Upper Arm Bike) level 1.7 x 3 minutes each fwd/bwd   Neck  Exercises: Standing   Neck Retraction --   UE Flexion with Stabilization Limitations rolling red ball up<>down wall while tracking ball with head to promote increased cervical extension motion and strengthening x 10 reps                                VC to follow ball with head, not just moving eyes up/down   Neck Exercises: Prone   Axial Exentsion 10 reps  x2   Axial Extension Limitations cues to lift head only as high as possible, AROM pt was able to clear towel roll with each lift.   W Back 10 reps;Limitations  x2 w/ 2-3 second holds   W Back Limitations W position, but could only lift one arm up at a time while lifting up head; VC for technique   Upper Extremity Flexion with Stabilization --   Other Prone Exercise Quadruped position: cervical extension x 10 reps; cues on form and technique needed for lifting head up high as possible  x2 sets w/ 2-3 sec holds   Moist Heat Therapy   Number Minutes Moist Heat 8 Minutes   Moist Heat Location Cervical  concurrent with IFC estim   Electrical Stimulation  Electrical Stimulation Location posterior cervical region, bil. upper trap (4 electrodes)   Electrical Stimulation Action IFC for decreasing pain   Electrical Stimulation Parameters 7.0 V  8 min   Electrical Stimulation Goals Pain;Other (comment)  muscle tightness           PT Education - 07/06/16 1152    Education provided Yes   Education Details Pt is to continue removing cervical collar 3x a day for at least 10 minutes while in supported, seated position (i.e. watching TV) and maintain upright/cervical extension to work on strengthening and endurance of cervical extension   Person(s) Educated Patient   Methods Explanation;Demonstration   Comprehension Verbalized understanding;Need further instruction          PT Short Term Goals - 06/15/16 1106    PT SHORT TERM GOAL #1   Title Report at least 25% improvement in cervical pain with holding head upright.  (06-08-16)    Baseline 06/15/16: pt now reporting no pain at rest, does get occasional pian on right side with increased cervical extension   Status Achieved   PT SHORT TERM GOAL #2   Title Tolerate wearing soft cerivcal collar to assist in holding head more upright.  (06-08-16)   Baseline 06/15/16: has been wearing since eval. has increased the size collar she is wearing to 4 inches and is actively weaning out of it.   Status Achieved   PT SHORT TERM GOAL #3   Title Independent in HEP for cervical strengthening and ROM - cervical retraction, rotation and cervical extension.   Baseline 06/15/16: met with HEP issued to date.   Period Weeks   Status Achieved   PT SHORT TERM GOAL #4   Title Demonstrate incr. cervical strength by holding head upright for at least 5" without use of cervical collar.   Baseline met on 06/15/16 in session.   Time --   Period --   Status Achieved           PT Long Term Goals - 05/13/16 1929    PT LONG TERM GOAL #1   Title Pt will demonstrate ability to hold head upright for at least 10" without use of soft cervical collar.  (07-08-16)   Time 8   Period Weeks   Status New   PT LONG TERM GOAL #2   Title Report at least 50% improvement in cervical pain.  (07-08-16)   Time 8   Period Weeks   Status New   PT LONG TERM GOAL #3   Title Pt will increase cervical extension so that she is able to lie supine with </= 2 pillows with c/o minimal neck pain.  (07-08-16)   Time 8   Period Weeks   Status New   PT LONG TERM GOAL #4   Title Independent in updated HEP for cervical strengthening.  (07-08-16)   Time 8   Period Weeks   Status New               Plan - 07/06/16 1157    Clinical Impression Statement Pt is demonstrating increased cervical extensor strength by holding head upright for longer periods of time. She should benefit from continued skilled PT to address ongoing goals.   Rehab Potential Good   PT Frequency 2x / week   PT Duration 8 weeks   PT  Treatment/Interventions ADLs/Self Care Home Management;Electrical Stimulation;Moist Heat;Ultrasound;Therapeutic exercise;Therapeutic activities;Functional mobility training;Gait training;Neuromuscular re-education;Patient/family education;Manual techniques;Passive range of motion   PT Next Visit Plan Check goals. Strengthening exs  and modalities for cervical pain   PT Home Exercise Plan cervical AROM - plan to add strengthening as tolerated; ADDED cervical extension prone on 05-30-16;  added cervical extension and rotation with yellow theraband on 06-13-16   Consulted and Agree with Plan of Care Patient      Patient will benefit from skilled therapeutic intervention in order to improve the following deficits and impairments:  Postural dysfunction, Decreased range of motion, Decreased strength, Increased muscle spasms, Hypomobility, Pain  Visit Diagnosis: Cervicalgia  Abnormal posture  Other symptoms and signs involving the musculoskeletal system  Muscle weakness (generalized)  Other specified disorders of muscle     Problem List There are no active problems to display for this patient.   Trixie Deis, SPTA 07/06/2016, 4:16 PM  [facility] [address] [place], [place], [address] Phone: [phone]   Fax:  [phone]  Name: YARIANA HOAGLUND MRN: [id number] Date of Birth: 07/15/1956  This note has been reviewed and edited by supervising CI.  Willow Ora, PTA, [facility] [address] [place], [place] [address] [phone] 07/07/2016, 9:24 AM

## 2016-07-07 ENCOUNTER — Encounter: Payer: Self-pay | Admitting: Physical Therapy

## 2016-07-07 ENCOUNTER — Ambulatory Visit: Payer: Medicare Other | Admitting: Physical Therapy

## 2016-07-07 DIAGNOSIS — M6281 Muscle weakness (generalized): Secondary | ICD-10-CM

## 2016-07-07 DIAGNOSIS — R293 Abnormal posture: Secondary | ICD-10-CM | POA: Diagnosis not present

## 2016-07-07 DIAGNOSIS — M6289 Other specified disorders of muscle: Secondary | ICD-10-CM

## 2016-07-07 DIAGNOSIS — R29898 Other symptoms and signs involving the musculoskeletal system: Secondary | ICD-10-CM | POA: Diagnosis not present

## 2016-07-07 DIAGNOSIS — M542 Cervicalgia: Secondary | ICD-10-CM

## 2016-07-07 NOTE — Therapy (Signed)
[facility] [address] [place], [place], [address] Phone: [phone]   Fax:  [phone]  Physical Therapy Treatment  Patient Details  Name: Julia Huff MRN: [id number] Date of Birth: 11/14/1956 Referring Provider: Dr. Hulan Fess  Encounter Date: 07/07/2016      PT End of Session - 07/07/16 1334    Visit Number 21   Number of Visits 22   Date for PT Re-Evaluation 07/07/16   Authorization Type Medicare   Authorization Time Period 05-08-16 - 07-07-16   PT Start Time 1230   PT Stop Time 1315   PT Time Calculation (min) 45 min   Equipment Utilized During Treatment Gait belt   Activity Tolerance Patient tolerated treatment well   Behavior During Therapy [facility] for tasks assessed/performed      Past Medical History  Diagnosis Date  . HTN (hypertension)   . Bipolar disorder ([place])   . Osteopenia   . Hypothyroidism   . Peripheral edema   . 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 Cellulitis     Past Surgical History  Procedure Laterality Date  . Appendectomy  2000  . Finger surgery    . Brain surgery  2008    due to  Rt PICA aneurysm rupture    There were no vitals filed for this visit.      Subjective Assessment - 07/07/16 1237    Subjective No pain, but pt complains of tightness in R posterolateral neck. Pt reports that her plug for her curling iron is up high and before she couldn't plug it in, but now she can reach it to plug it in. She is removing the collar a little more.   Pertinent History aneurysm  - hospitalization Dec. 19, 2008 - Jan. 9, 2009   Diagnostic tests x - rays -- moderate arthritis   Patient Stated Goals be able to hold head up   Currently in Pain? No/denies   Pain Score 0-No pain           [facility] Adult PT Treatment/Exercise - 07/07/16 1230    Neck Exercises: Machines  for Strengthening   UBE (Upper Arm Bike) level 1.7 x 4 minutes each fwd/bwd   Neck Exercises: Standing   UE Flexion with Stabilization Limitations rolling red ball up<>down wall while tracking ball with head to promote increased cervical extension motion and strengthening 10 reps x 3 sets                               VC to follow ball with head, not just moving eyes up/down   Neck Exercises: Prone   Axial Exentsion 10 reps  x3 (2-3 second holds at top of head lift)   Axial Extension Limitations cues to lift head only as high as possible, AROM pt was able to clear towel roll with each lift.   Other Prone Exercise Quadruped position: cervical extension 10 reps; cues on form and technique needed for lifting head up high as possible  x3 sets w/ 2-3 sec holds   Moist Heat Therapy   Number Minutes Moist Heat 10 Minutes   Moist Heat Location Cervical  concurrent with IFC estim   Electrical Stimulation   Electrical Stimulation Location posterior cervical region, bil. upper trap (4 electrodes)   Electrical Stimulation Action IFC for decreasing  pain   Electrical Stimulation Parameters 10.0 V   Electrical Stimulation Goals Pain;Other (comment)  muscle tightness            PT Education - 07/07/16 1332    Education provided Yes   Education Details Pt is to continue trying to not wear collar while in supported, seated position (i.e. watching TV)   Person(s) Educated Patient   Methods Explanation   Comprehension Verbalized understanding;Need further instruction          PT Short Term Goals - 06/15/16 1106    PT SHORT TERM GOAL #1   Title Report at least 25% improvement in cervical pain with holding head upright.  (06-08-16)   Baseline 06/15/16: pt now reporting no pain at rest, does get occasional pian on right side with increased cervical extension   Status Achieved   PT SHORT TERM GOAL #2   Title Tolerate wearing soft cerivcal collar to assist in holding head more upright.  (06-08-16)    Baseline 06/15/16: has been wearing since eval. has increased the size collar she is wearing to 4 inches and is actively weaning out of it.   Status Achieved   PT SHORT TERM GOAL #3   Title Independent in HEP for cervical strengthening and ROM - cervical retraction, rotation and cervical extension.   Baseline 06/15/16: met with HEP issued to date.   Period Weeks   Status Achieved   PT SHORT TERM GOAL #4   Title Demonstrate incr. cervical strength by holding head upright for at least 5" without use of cervical collar.   Baseline met on 06/15/16 in session.   Time --   Period --   Status Achieved           PT Long Term Goals - 07/07/16 1338    PT LONG TERM GOAL #1   Title Pt will demonstrate ability to hold head upright for at least 10" without use of soft cervical collar.  (07-08-16)   Time 8   Period Weeks   Status On-going   PT LONG TERM GOAL #2   Title Report at least 50% improvement in cervical pain.  (07-08-16)   Time 8   Period Weeks   Status On-going   PT LONG TERM GOAL #3   Title Pt will increase cervical extension so that she is able to lie supine with </= 2 pillows with c/o minimal neck pain.  (07-08-16)   Time 8   Period Weeks   Status On-going   PT LONG TERM GOAL #4   Title Independent in updated HEP for cervical strengthening.  (07-08-16)   Time 8   Period Weeks   Status On-going           Plan - 07/07/16 1335    Clinical Impression Statement Pt is continuing to demonstrate increased cervical extensor strength by holding head upright for longer periods of time with minimal complaints of pain. She has progressed appropriately toward all goals. She should benefit from continued skilled PT to address ongoing goals.   Rehab Potential Good   PT Frequency 2x / week   PT Duration 8 weeks   PT Treatment/Interventions ADLs/Self Care Home Management;Electrical Stimulation;Moist Heat;Ultrasound;Therapeutic exercise;Therapeutic activities;Functional mobility  training;Gait training;Neuromuscular re-education;Patient/family education;Manual techniques;Passive range of motion   PT Next Visit Plan Check goals- PT to renew vs discharge at next session depending on progress toward goals and remaining deficits/potential for continued improvement.. Strengthening exs and modalities for cervical pain   PT Home Exercise Plan  cervical AROM - plan to add strengthening as tolerated; ADDED cervical extension prone on 05-30-16;  added cervical extension and rotation with yellow theraband on 06-13-16   Consulted and Agree with Plan of Care Patient      Patient will benefit from skilled therapeutic intervention in order to improve the following deficits and impairments:  Postural dysfunction, Decreased range of motion, Decreased strength, Increased muscle spasms, Hypomobility, Pain  Visit Diagnosis: Cervicalgia  Abnormal posture  Other symptoms and signs involving the musculoskeletal system  Muscle weakness (generalized)  Other specified disorders of muscle     Problem List There are no active problems to display for this patient.   Trixie Deis, SPTA 07/07/2016, 1:48 PM  [facility] [address] [place], [place], [address] Phone: [phone]   Fax:  [phone]  Name: Julia Huff MRN: [id number] Date of Birth: 1956/01/16  This note has been reviewed and edited by supervising CI.  Willow Ora, PTA, [facility] [address] [place], [place] [address] [phone] 07/09/2016, 7:03 PM

## 2016-07-11 ENCOUNTER — Ambulatory Visit: Payer: Medicare Other | Admitting: Physical Therapy

## 2016-07-11 DIAGNOSIS — M6281 Muscle weakness (generalized): Secondary | ICD-10-CM

## 2016-07-11 DIAGNOSIS — R293 Abnormal posture: Secondary | ICD-10-CM | POA: Diagnosis not present

## 2016-07-11 DIAGNOSIS — M542 Cervicalgia: Secondary | ICD-10-CM

## 2016-07-11 DIAGNOSIS — M6289 Other specified disorders of muscle: Secondary | ICD-10-CM | POA: Diagnosis not present

## 2016-07-11 DIAGNOSIS — R29898 Other symptoms and signs involving the musculoskeletal system: Secondary | ICD-10-CM | POA: Diagnosis not present

## 2016-07-13 ENCOUNTER — Ambulatory Visit: Payer: Managed Care, Other (non HMO) | Admitting: Physical Therapy

## 2016-07-16 NOTE — Therapy (Signed)
[facility] [address] [place], [place], [address] Phone: [phone]   Fax:  [phone]  Physical Therapy Treatment  Patient Details  Name: Julia Huff MRN: [id number] Date of Birth: Dec 22, 1956 Referring Provider: Dr. Hulan Fess  Encounter Date: 07/11/2016      PT End of Session - 07/16/16 1221    Visit Number 22   Date for PT Re-Evaluation 07/07/16   Authorization Type Medicare   Authorization Time Period 05-08-16 - 07-07-16   PT Start Time 6962   PT Stop Time 1231   PT Time Calculation (min) 46 min      Past Medical History:  Diagnosis Date  . Bipolar disorder ([place])   . Cellulitis   . HTN (hypertension)   . Hypothyroidism   . Osteopenia   . Peripheral edema   . 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 APPENDECTOMY  2000  . BRAIN SURGERY  2008   due to  Rt PICA aneurysm rupture  . FINGER SURGERY      There were no vitals filed for this visit.      Subjective Assessment - 07/16/16 1217    Subjective Pt reports she is doing well - feels that she is ready to be discharged today; states neck is much better and she reports not wearing collar much at home   Pertinent History aneurysm  - hospitalization Dec. 19, 2008 - Jan. 9, 2009   Diagnostic tests x - rays -- moderate arthritis   Patient Stated Goals be able to hold head up   Currently in Pain? No/denies                         [facility] Adult PT Treatment/Exercise - 07/16/16 0001      Neck Exercises: Machines for Strengthening   UBE (Upper Arm Bike) level 2.0 x 4 minutes each fwd/bwd     Neck Exercises: Standing   Neck Retraction 10 reps   Other Standing Exercises Rolling blue ball up wall for UE flexion with cervical extension     Moist Heat Therapy   Number  Minutes Moist Heat 8 Minutes  transportation arrived early for pick up     Electrical Stimulation   Electrical Stimulation Location posterior cervical region, bil. upper trap (4 electrodes)   Electrical Stimulation Action IFC for decreasing pain   Electrical Stimulation Parameters 10 v   Electrical Stimulation Goals Pain;Other (comment)  muscle tightness       Cervical strengthening = seated chest press with 15# 2 sets 10 reps Lat pull down to chest - seated - 15# 1 set 10 reps With elbows flexed and 1 set 10 reps with elbows extended            PT Short Term Goals - 06/15/16 1106      PT SHORT TERM GOAL #1   Title Report at least 25% improvement in cervical pain with holding head upright.  (06-08-16)   Baseline 06/15/16: pt now reporting no pain at rest, does get occasional pian on right side with increased cervical extension   Status Achieved     PT SHORT TERM GOAL #2   Title Tolerate wearing soft cerivcal collar to assist in holding head more upright.  (06-08-16)   Baseline 06/15/16: has been wearing since  eval. has increased the size collar she is wearing to 4 inches and is actively weaning out of it.   Status Achieved     PT SHORT TERM GOAL #3   Title Independent in HEP for cervical strengthening and ROM - cervical retraction, rotation and cervical extension.   Baseline 06/15/16: met with HEP issued to date.   Period Weeks   Status Achieved     PT SHORT TERM GOAL #4   Title Demonstrate incr. cervical strength by holding head upright for at least 5" without use of cervical collar.   Baseline met on 06/15/16 in session.   Time --   Period --   Status Achieved           PT Long Term Goals - 07/16/16 1222      PT LONG TERM GOAL #1   Title Pt will demonstrate ability to hold head upright for at least 10" without use of soft cervical collar.  (07-08-16)   Baseline met 07-11-16   Status Achieved     PT LONG TERM GOAL #2   Title Report at least 50% improvement in  cervical pain.  (07-08-16)   Baseline met 07-11-16   Status Achieved     PT LONG TERM GOAL #3   Title Pt will increase cervical extension so that she is able to lie supine with </= 2 pillows with c/o minimal neck pain.  (07-08-16)   Status Achieved     PT LONG TERM GOAL #4   Title Independent in updated HEP for cervical strengthening.  (07-08-16)   Status Achieved               Plan - 07/16/16 1223    Clinical Impression Statement Pt has met all LTG's; pt continues to wear soft cervical collar to assist with neck extension, especially when fatigued; pt states that she is pleased with progress and feels ready for discharge at this time   Rehab Potential Good   PT Frequency 2x / week   PT Duration 8 weeks   PT Treatment/Interventions ADLs/Self Care Home Management;Electrical Stimulation;Moist Heat;Ultrasound;Therapeutic exercise;Therapeutic activities;Functional mobility training;Gait training;Neuromuscular re-education;Patient/family education;Manual techniques;Passive range of motion   PT Next Visit Plan D/C   PT Home Exercise Plan cervical AROM - plan to add strengthening as tolerated; ADDED cervical extension prone on 05-30-16;  added cervical extension and rotation with yellow theraband on 06-13-16   Consulted and Agree with Plan of Care Patient      Patient will benefit from skilled therapeutic intervention in order to improve the following deficits and impairments:  Postural dysfunction, Decreased range of motion, Decreased strength, Increased muscle spasms, Hypomobility, Pain  Visit Diagnosis: Muscle weakness (generalized)  Cervicalgia  Other symptoms and signs involving the musculoskeletal system     Problem List There are no active problems to display for this patient.   PHYSICAL THERAPY DISCHARGE SUMMARY  Visits from Start of Care: 22 Current functional level related to goals / functional outcomes: ALL LTG's have been met - pt is able to hold head upright for  longer periods of time; cont to wear cervical collar when fatigued   Remaining deficits: Continued cervical musc. Weakness resulting in cervical flexion with decr. Ability to hold head upright for prolonged periods of time   Education / Equipment: Pt has been educated in a HEP for cervical strengthening exercises. Plan: Patient agrees to discharge.  Patient goals were met. Patient is being discharged due to meeting the stated rehab goals.  ?????  Alda Lea, PT 07/16/2016, 12:29 PM  [facility] [address] [place], [place], [address] Phone: [phone]   Fax:  [phone]  Name: KRISTEN FROMM MRN: [id number] Date of Birth: 1956-04-03

## 2016-07-18 ENCOUNTER — Ambulatory Visit: Payer: Managed Care, Other (non HMO) | Admitting: Physical Therapy

## 2016-07-20 ENCOUNTER — Ambulatory Visit: Payer: Managed Care, Other (non HMO) | Admitting: Physical Therapy

## 2016-09-21 DIAGNOSIS — F251 Schizoaffective disorder, depressive type: Secondary | ICD-10-CM | POA: Diagnosis not present

## 2016-11-01 DIAGNOSIS — M899 Disorder of bone, unspecified: Secondary | ICD-10-CM | POA: Diagnosis not present

## 2016-11-01 DIAGNOSIS — R8290 Unspecified abnormal findings in urine: Secondary | ICD-10-CM | POA: Diagnosis not present

## 2016-11-01 DIAGNOSIS — Z0000 Encounter for general adult medical examination without abnormal findings: Secondary | ICD-10-CM | POA: Diagnosis not present

## 2016-11-01 DIAGNOSIS — F319 Bipolar disorder, unspecified: Secondary | ICD-10-CM | POA: Diagnosis not present

## 2016-11-01 DIAGNOSIS — I10 Essential (primary) hypertension: Secondary | ICD-10-CM | POA: Diagnosis not present

## 2016-11-01 DIAGNOSIS — E039 Hypothyroidism, unspecified: Secondary | ICD-10-CM | POA: Diagnosis not present

## 2016-11-01 DIAGNOSIS — M8580 Other specified disorders of bone density and structure, unspecified site: Secondary | ICD-10-CM | POA: Diagnosis not present

## 2016-11-01 DIAGNOSIS — R7301 Impaired fasting glucose: Secondary | ICD-10-CM | POA: Diagnosis not present

## 2016-11-13 DIAGNOSIS — Z1231 Encounter for screening mammogram for malignant neoplasm of breast: Secondary | ICD-10-CM | POA: Diagnosis not present

## 2016-12-07 DIAGNOSIS — F251 Schizoaffective disorder, depressive type: Secondary | ICD-10-CM | POA: Diagnosis not present

## 2017-02-08 DIAGNOSIS — F251 Schizoaffective disorder, depressive type: Secondary | ICD-10-CM | POA: Diagnosis not present

## 2017-03-01 DIAGNOSIS — F251 Schizoaffective disorder, depressive type: Secondary | ICD-10-CM | POA: Diagnosis not present

## 2017-08-03 DIAGNOSIS — R609 Edema, unspecified: Secondary | ICD-10-CM | POA: Diagnosis not present

## 2017-08-03 DIAGNOSIS — I10 Essential (primary) hypertension: Secondary | ICD-10-CM | POA: Diagnosis not present

## 2017-08-03 DIAGNOSIS — E669 Obesity, unspecified: Secondary | ICD-10-CM | POA: Diagnosis not present

## 2017-08-03 DIAGNOSIS — Z6834 Body mass index (BMI) 34.0-34.9, adult: Secondary | ICD-10-CM | POA: Diagnosis not present

## 2017-08-06 ENCOUNTER — Other Ambulatory Visit: Payer: Self-pay | Admitting: Family Medicine

## 2017-08-06 DIAGNOSIS — R609 Edema, unspecified: Secondary | ICD-10-CM

## 2017-08-09 ENCOUNTER — Ambulatory Visit
Admission: RE | Admit: 2017-08-09 | Discharge: 2017-08-09 | Disposition: A | Discharge status: Home / Self Care | Payer: Medicare Other | Source: Ambulatory Visit | Attending: Family Medicine | Admitting: Family Medicine

## 2017-08-09 DIAGNOSIS — R609 Edema, unspecified: Secondary | ICD-10-CM

## 2017-08-13 ENCOUNTER — Other Ambulatory Visit: Payer: Managed Care, Other (non HMO)

## 2017-08-17 DIAGNOSIS — Z6834 Body mass index (BMI) 34.0-34.9, adult: Secondary | ICD-10-CM | POA: Diagnosis not present

## 2017-08-17 DIAGNOSIS — E669 Obesity, unspecified: Secondary | ICD-10-CM | POA: Diagnosis not present

## 2017-08-17 DIAGNOSIS — R600 Localized edema: Secondary | ICD-10-CM | POA: Diagnosis not present

## 2017-08-17 DIAGNOSIS — I10 Essential (primary) hypertension: Secondary | ICD-10-CM | POA: Diagnosis not present

## 2017-11-21 ENCOUNTER — Other Ambulatory Visit: Payer: Self-pay | Admitting: Family Medicine

## 2017-11-21 ENCOUNTER — Other Ambulatory Visit (HOSPITAL_COMMUNITY)
Admission: RE | Admit: 2017-11-21 | Discharge: 2017-11-21 | Disposition: A | Discharge status: Home / Self Care | Payer: Medicare Other | Source: Ambulatory Visit | Attending: Family Medicine | Admitting: Family Medicine

## 2017-11-21 DIAGNOSIS — Z124 Encounter for screening for malignant neoplasm of cervix: Secondary | ICD-10-CM | POA: Insufficient documentation

## 2017-11-23 LAB — CYTOLOGY - PAP
Diagnosis: NEGATIVE
HPV: NOT DETECTED

## 2019-05-23 ENCOUNTER — Ambulatory Visit (INDEPENDENT_AMBULATORY_CARE_PROVIDER_SITE_OTHER): Payer: Medicare Other

## 2019-05-23 ENCOUNTER — Other Ambulatory Visit: Payer: Self-pay

## 2019-05-23 ENCOUNTER — Ambulatory Visit (INDEPENDENT_AMBULATORY_CARE_PROVIDER_SITE_OTHER): Payer: Medicare Other | Admitting: Podiatry

## 2019-05-23 VITALS — Temp 98.1°F

## 2019-05-23 DIAGNOSIS — M7741 Metatarsalgia, right foot: Secondary | ICD-10-CM | POA: Diagnosis not present

## 2019-05-23 DIAGNOSIS — M2041 Other hammer toe(s) (acquired), right foot: Secondary | ICD-10-CM | POA: Diagnosis not present

## 2019-05-23 DIAGNOSIS — M2042 Other hammer toe(s) (acquired), left foot: Secondary | ICD-10-CM

## 2019-05-23 DIAGNOSIS — G5761 Lesion of plantar nerve, right lower limb: Secondary | ICD-10-CM

## 2019-05-23 DIAGNOSIS — G5762 Lesion of plantar nerve, left lower limb: Secondary | ICD-10-CM

## 2019-05-23 DIAGNOSIS — M7742 Metatarsalgia, left foot: Secondary | ICD-10-CM

## 2019-05-23 DIAGNOSIS — M79671 Pain in right foot: Secondary | ICD-10-CM

## 2019-05-23 DIAGNOSIS — M79672 Pain in left foot: Secondary | ICD-10-CM

## 2019-05-23 NOTE — Patient Instructions (Signed)
Morton Neuralgia    Morton neuralgia is foot pain that affects the ball of the foot and the area near the toes. Morton neuralgia occurs when part of a nerve in the foot (digital nerve) is under too much pressure (compressed). When this happens over a long period of time, the nerve can thicken (neuroma) and cause pain. Pain usually occurs between the third and fourth toes.   Morton neuralgia can come and go but may get worse over time.  What are the causes?  This condition is caused by doing the same things over and over with your foot, such as:  · Activities such as running or jumping.  · Wearing shoes that are too tight.  What increases the risk?  You may be at higher risk for Morton neuralgia if you:  · Are female.  · Wear high heels.  · Wear shoes that are narrow or tight.  · Do activities that repeatedly stretch your toes, such as:  ? Running.  ? Ballet.  ? Long-distance walking.  What are the signs or symptoms?  The first symptom of Morton neuralgia is pain that spreads from the ball of the foot to the toes. It may feel like you are walking on a marble. Pain usually gets worse with walking and goes away at night. Other symptoms may include numbness and cramping of your toes. Both feet are equally affected, but rarely at the same time.  How is this diagnosed?  This condition is diagnosed based on your symptoms, your medical history, and a physical exam. Your health care provider may:  · Squeeze your foot just behind your toe.  · Ask you to move your toes to check for pain.  · Ask about your physical activity level.  You also may have imaging tests, such as an X-ray, ultrasound, or MRI.  How is this treated?  Treatment depends on how severe your condition is and what causes it. Treatment may involve:  · Wearing different shoes that are not too tight, are low-heeled, and provide good support. For some people, this is the only treatment needed.  · Wearing an over-the-counter or custom supportive pad (orthotic)  under the front of your foot.  · Getting injections of numbing medicine and anti-inflammatory medicine (steroid) in the nerve.  · Having surgery to remove part of the thickened nerve.  Follow these instructions at home:  Managing pain, stiffness, and swelling    · Massage your foot as needed.  · Wear orthotics as told by your health care provider.  · If directed, put ice on your foot:  ? Put ice in a plastic bag.  ? Place a towel between your skin and the bag.  ? Leave the ice on for 20 minutes, 2-3 times a day.  · Avoid activities that cause pain or make pain worse. If you play sports, ask your health care provider when it is safe for you to return to sports.  · Raise (elevate) your foot above the level of your heart while lying down and, when possible, while sitting.  General instructions  · Take over-the-counter and prescription medicines only as told by your health care provider.  · Do not drive or use heavy machinery while taking prescription pain medicine.  · Wear shoes that:  ? Have soft soles.  ? Have a wide toe area.  ? Provide arch support.  ? Do not pinch or squeeze your feet.  ? Have room for your orthotics, if applicable.  ·   Keep all follow-up visits as told by your health care provider. This is important.  Contact a health care provider if:  · Your symptoms get worse or do not get better with treatment and home care.  Summary  · Morton neuralgia is foot pain that affects the ball of the foot and the area near the toes. Pain usually occurs between the third and fourth toes, gets worse with walking, and goes away at night.  · Morton neuralgia occurs when part of a nerve in the foot (digital nerve) is under too much pressure. When this happens over a long period of time, the nerve can thicken (neuroma) and cause pain.  · This condition is caused by doing the same things over and over with your foot, such as running or jumping, wearing shoes that are too tight, or wearing high heels.  · Treatment may  involve wearing low-heeled shoes that are not too tight, wearing a supportive pad (orthotic) under the front of your foot, getting injections in the nerve, or having surgery to remove part of the thickened nerve.  This information is not intended to replace advice given to you by your health care provider. Make sure you discuss any questions you have with your health care provider.  Document Released: 03/19/2001 Document Revised: 12/25/2017 Document Reviewed: 12/25/2017  Elsevier Interactive Patient Education © 2019 [employer]

## 2019-05-23 NOTE — Progress Notes (Signed)
Subjective:  Patient ID: Julia Huff, female    DOB: [birth year],  MRN: [id number]  Chief Complaint  Patient presents with  . Foot Problem    Pt states bilateral numbness in digits 1-5 as well as plantar heels. Pt states loss of mobility in digits 1-5 bilateral. Pt states 70yr duration.     63 y.o. female presents with the above complaint.  History above confirmed with patient  Review of Systems: Negative except as noted in the HPI. Denies N/V/F/Ch.  Past Medical History:  Diagnosis Date  . Bipolar disorder (HCC)   . Cellulitis   . HTN (hypertension)   . Hypothyroidism   . Osteopenia   . Peripheral edema   . 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  acetaminophen (TYLENOL) 500 MG tablet, Take 500 mg by mouth every 6 (six) hours as needed for mild pain., Disp: , Rfl:  .  acyclovir (ZOVIRAX) 800 MG tablet, TK 1 T PO FID FOR 7 DAYS UTD, Disp: , Rfl:  .  ARIPiprazole (ABILIFY) 5 MG tablet, Take 5 mg by mouth daily., Disp: , Rfl: 0 .  Cholecalciferol (VITAMIN D) 2000 units tablet, Take 2,000 Units by mouth daily., Disp: , Rfl:  .  divalproex (DEPAKOTE SPRINKLE) 125 MG capsule, Take 250 mg by mouth 2 (two) times daily., Disp: , Rfl: 0 .  hydrochlorothiazide (MICROZIDE) 12.5 MG capsule, 1 capsule Once a day Orally 90 days, Disp: , Rfl: 0 .  levothyroxine (SYNTHROID, LEVOTHROID) 75 MCG tablet, Take 75 mcg by mouth daily., Disp: , Rfl: 0 .  Multiple Vitamin (MULTIVITAMIN WITH MINERALS) TABS tablet, Take 1 tablet by mouth daily., Disp: , Rfl:  .  OLANZapine (ZYPREXA) 10 MG tablet, Take 10 mg by mouth at bedtime. Take with olanzapine 5mg , Disp: , Rfl:  .  potassium chloride (K-DUR) 10 MEQ tablet, , Disp: , Rfl: 1 .  potassium chloride (K-DUR) 10 MEQ tablet, TK 1 T PO QD WF, Disp: , Rfl:  .  triamterene-hydrochlorothiazide (MAXZIDE-25) 37.5-25 MG  tablet, TK 1 T PO QD IN THE MORNING, Disp: , Rfl:  .  alendronate (FOSAMAX) 70 MG tablet, , Disp: , Rfl: 0 .  cephALEXin (KEFLEX) 500 MG capsule, Take 500 mg by mouth 2 (two) times daily., Disp: , Rfl: 0 .  temazepam (RESTORIL) 30 MG capsule, Take 30 mg by mouth at bedtime. Reported on 04/25/2016, Disp: , Rfl:   Social History   Tobacco Use  Smoking Status Former Smoker  Smokeless Tobacco Never Used  Tobacco Comment   quit 2008    Allergies  Allergen Reactions  . Asendin [Amoxapine]     unknown  . Fluorescein     Unknown   . Lithobid [Lithium]     Unknown  . Thorazine [Chlorpromazine]     Unknown    Objective:   Vitals:   05/23/19 1322  Temp: 98.1 F (36.7 C)   There is no height or weight on file to calculate BMI. Constitutional Well developed. Well nourished.  Vascular Dorsalis pedis pulses palpable bilaterally. Posterior tibial pulses palpable bilaterally. Capillary refill normal to all digits.  No cyanosis or clubbing noted. Pedal hair growth normal.  Neurologic Normal speech. Oriented to person, place, and time. Epicritic sensation to light touch grossly present bilaterally.  Dermatologic Nails well groomed and normal in appearance. No open wounds. No skin lesions.  Orthopedic:  POP 3rd Interspace Bilat with Mulder's click left Hammertoe deformities noted bilaterally   Radiographs: Taken and reviewed no acute fractures dislocations hammertoe deformities noted bilaterally Assessment:   1. Morton's neuroma, right   2. Morton's neuroma, left   3. Metatarsalgia of both feet   4. Hammertoes of both feet    Plan:  Patient was evaluated and treated and all questions answered.  Interdigital Neuroma, bilaterally -Educated on etiology -Interspace injection delivered as below. -Educated on padding and proper shoegear  Procedure: Neuroma Injection Location: Bilateral 3rd interspace Skin Prep: Alcohol. Injectate: 0.5 cc 0.5% marcaine plain, 0.5 cc  dexamethasone phosphate. Disposition: Patient tolerated procedure well. Injection site dressed with a band-aid.    Return in about 4 weeks (around 06/20/2019) for Neuroma, Bilateral.

## 2019-05-26 ENCOUNTER — Other Ambulatory Visit: Payer: Self-pay | Admitting: Podiatry

## 2019-05-26 DIAGNOSIS — G5761 Lesion of plantar nerve, right lower limb: Secondary | ICD-10-CM

## 2019-05-26 DIAGNOSIS — G5762 Lesion of plantar nerve, left lower limb: Secondary | ICD-10-CM

## 2019-06-05 ENCOUNTER — Telehealth: Payer: Self-pay | Admitting: Podiatry

## 2019-06-05 NOTE — Telephone Encounter (Signed)
Pt received an injection in her both her feet. She wanted to know how long it would take to start working. Please call patient

## 2019-06-05 NOTE — Telephone Encounter (Signed)
I called pt and told her the injections she was given each injection builds on the previous and she should need to back off on activity, wear stiff bottom wider shoes and try ice to the areas. Pt states understanding.

## 2019-06-19 ENCOUNTER — Ambulatory Visit: Payer: Medicare Other | Admitting: Podiatry

## 2019-06-22 ENCOUNTER — Other Ambulatory Visit: Payer: Self-pay

## 2019-06-22 ENCOUNTER — Encounter (HOSPITAL_COMMUNITY): Payer: Self-pay | Admitting: Emergency Medicine

## 2019-06-22 ENCOUNTER — Emergency Department (HOSPITAL_COMMUNITY)
Admission: EM | Admit: 2019-06-22 | Discharge: 2019-06-22 | Disposition: A | Discharge status: Home / Self Care | Payer: Medicare Other | Attending: Emergency Medicine | Admitting: Emergency Medicine

## 2019-06-22 DIAGNOSIS — Z79899 Other long term (current) drug therapy: Secondary | ICD-10-CM | POA: Diagnosis not present

## 2019-06-22 DIAGNOSIS — F319 Bipolar disorder, unspecified: Secondary | ICD-10-CM | POA: Insufficient documentation

## 2019-06-22 DIAGNOSIS — Z87891 Personal history of nicotine dependence: Secondary | ICD-10-CM | POA: Diagnosis not present

## 2019-06-22 DIAGNOSIS — K5641 Fecal impaction: Secondary | ICD-10-CM

## 2019-06-22 DIAGNOSIS — I10 Essential (primary) hypertension: Secondary | ICD-10-CM | POA: Diagnosis not present

## 2019-06-22 DIAGNOSIS — E039 Hypothyroidism, unspecified: Secondary | ICD-10-CM | POA: Insufficient documentation

## 2019-06-22 DIAGNOSIS — K5900 Constipation, unspecified: Secondary | ICD-10-CM | POA: Diagnosis present

## 2019-06-22 MED ORDER — FLEET ENEMA 7-19 GM/118ML RE ENEM
1.0000 | ENEMA | Freq: Once | RECTAL | Status: AC
  Administered 2019-06-22: 1 via RECTAL
  Filled 2019-06-22: qty 1

## 2019-06-22 MED ORDER — POLYETHYLENE GLYCOL 3350 17 GM/SCOOP PO POWD: 17.0000 g | Freq: Two times a day (BID) | ORAL | 0 refills | Status: DC

## 2019-06-22 NOTE — ED Provider Notes (Signed)
[facility] Note   CSN: [id number] Arrival date & time: 06/22/19  1248     History   Chief Complaint Chief Complaint  Patient presents with  . Constipation    HPI Julia AdesCheryl A Huff is a 63 y.o. female.  Who presents emergency department chief complaint of constipation.  Patient states that she was seen by her primary care yesterday was told that she had a fecal impaction.  She was given Dulcolax overnight states that she still not made a bowel movement has been several days since she has been able to go.  She complains of rectal urgency.  She denies nausea, vomiting.     HPI  Past Medical History:  Diagnosis Date  . Bipolar disorder (HCC)   . Cellulitis   . HTN (hypertension)   . Hypothyroidism   . Osteopenia   . Peripheral edema   . 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   Past Surgical History:  Procedure Laterality Date  . APPENDECTOMY  2000  . BRAIN SURGERY  2008   due to  Rt PICA aneurysm rupture  . FINGER SURGERY       OB History   No obstetric history on file.      Home Medications    Prior to Admission medications   Medication Sig Start Date End Date Taking? Authorizing Provider  acetaminophen (TYLENOL) 500 MG tablet Take 500 mg by mouth every 6 (six) hours as needed for mild pain.    [provider]  acyclovir (ZOVIRAX) 800 MG tablet TK 1 T PO FID FOR 7 DAYS UTD 01/26/19   [provider]  alendronate (FOSAMAX) 70 MG tablet  04/15/16   [provider]  ARIPiprazole (ABILIFY) 5 MG tablet Take 5 mg by mouth daily. 02/14/16   [provider]  cephALEXin (KEFLEX) 500 MG capsule Take 500 mg by mouth 2 (two) times daily. 03/28/16   [provider]  Cholecalciferol (VITAMIN D) 2000 units tablet Take 2,000  Units by mouth daily.    [provider]  divalproex (DEPAKOTE SPRINKLE) 125 MG capsule Take 250 mg by mouth 2 (two) times daily. 02/15/16   [provider]  hydrochlorothiazide (MICROZIDE) 12.5 MG capsule 1 capsule Once a day Orally 90 days 04/24/16   [provider]  levothyroxine (SYNTHROID, LEVOTHROID) 75 MCG tablet Take 75 mcg by mouth daily. 02/15/16   [provider]  Multiple Vitamin (MULTIVITAMIN WITH MINERALS) TABS tablet Take 1 tablet by mouth daily.    [provider]  OLANZapine (ZYPREXA) 10 MG tablet Take 10 mg by mouth at bedtime. Take with olanzapine 5mg  03/23/16   [provider]  potassium chloride (K-DUR) 10 MEQ tablet  04/24/16   [provider]  potassium chloride (K-DUR) 10 MEQ tablet TK 1 T PO QD WF 04/30/19   [provider]  temazepam (RESTORIL) 30 MG capsule Take 30 mg by mouth at bedtime. Reported on 04/25/2016 03/23/16   [provider]  triamterene-hydrochlorothiazide (MAXZIDE-25) 37.5-25 MG tablet TK 1 T PO QD IN THE MORNING 04/30/19   [provider]    Family History Family History  Problem Relation Age of Onset  . Lung cancer Father   . Hypertension Mother   . Dementia Mother   . Hypertension Sister   . Healthy  Brother   . Healthy Sister     Social History Social History   Tobacco Use  . Smoking status: Former Research scientist (life sciences)  . Smokeless tobacco: Never Used  . Tobacco comment: quit 2008  Substance Use Topics  . Alcohol use: Yes    Alcohol/week: 0.0 standard drinks    Comment: occasional wine with diner  . Drug use: No     Allergies   Asendin [amoxapine], Fluorescein, Lithobid [lithium], and Thorazine [chlorpromazine]   Review of Systems Review of Systems   Ten systems reviewed and are negative for acute change, except as noted in the HPI.   Physical Exam Updated Vital Signs BP (!) 125/97   Pulse 90   Temp 98.5 F (36.9 C) (Oral)   Resp (!) 21   Ht 5' 2.5" (1.588  m)   Wt 48.5 kg   SpO2 96%   BMI 19.26 kg/m   Physical Exam Vitals signs and nursing note reviewed.  Constitutional:      General: She is not in acute distress.    Appearance: She is well-developed. She is not diaphoretic.  HENT:     Head: Normocephalic and atraumatic.  Eyes:     General: No scleral icterus.    Conjunctiva/sclera: Conjunctivae normal.  Neck:     Musculoskeletal: Normal range of motion.  Cardiovascular:     Rate and Rhythm: Normal rate and regular rhythm.     Heart sounds: Normal heart sounds. No murmur. No friction rub. No gallop.   Pulmonary:     Effort: Pulmonary effort is normal. No respiratory distress.     Breath sounds: Normal breath sounds.  Abdominal:     General: Bowel sounds are normal. There is no distension.     Palpations: Abdomen is soft. There is no mass.     Tenderness: There is no abdominal tenderness. There is no guarding.  Skin:    General: Skin is warm and dry.  Neurological:     Mental Status: She is alert and oriented to person, place, and time.  Psychiatric:        Behavior: Behavior normal.      ED Treatments / Results  Labs (all labs ordered are listed, but only abnormal results are displayed) Labs Reviewed - No data to display  EKG    Radiology No results found.  Procedures Fecal disimpaction  Date/Time: 06/22/2019 3:09 PM Performed by: Margarita Mail, PA-C Authorized by: Margarita Mail, PA-C  Consent: Verbal consent obtained. Consent given by: patient Patient identity confirmed: verbally with patient Time out: Immediately prior to procedure a "time out" was called to verify the correct patient, procedure, equipment, support staff and site/side marked as required. Local anesthesia used: no  Anesthesia: Local anesthesia used: no  Sedation: Patient sedated: no  Patient tolerance: patient tolerated the procedure well with no immediate complications    (including critical care time)  Medications Ordered in  ED Medications - No data to display   Initial Impression / Assessment and Plan / ED Course  I have reviewed the triage vital signs and the nursing notes.  Pertinent labs & imaging results that were available during my care of the patient were reviewed by me and considered in my medical decision making (see chart for details).       Is a 63 year old female who presents with fecal impaction.  She is having no vomiting or abdominal pain.  I doubt any other significant cause of her symptoms such as small bowel obstruction, volvulus especially in  the setting of benign exam and pain-free abdomen.  Patient was successfully disimpacted without complication, she had a fleets enema with large bowel movement and is feeling greatly relieved at this point.  She is otherwise hemodynamically stable and afebrile.  Patient will be discharged with MiraLAX twice daily, close outpatient follow-up.  I discussed return precautions.  Final Clinical Impressions(s) / ED Diagnoses   Final diagnoses:  None    ED Discharge Orders    None       Arthor CaptainHarris, Lizzette Carbonell, PA-C 06/24/19 1336    Jacalyn LefevreHaviland, Julie, MD 06/25/19 1429

## 2019-06-22 NOTE — ED Notes (Signed)
Sherie Don [phone]. sister

## 2019-06-22 NOTE — Discharge Instructions (Addendum)
Contact a health care provider if: You have ongoing pain in your rectum. You need to use an enema or a suppository more than 2 times a week. You have rectal bleeding. You continue to have problems. The problems may include not being able to go to the bathroom and long-term (chronic) constipation. You have pain in your abdomen. You have thin, pencil-like stools. Get help right away if: You have black or tarry stools.

## 2019-06-22 NOTE — ED Triage Notes (Signed)
Pt reports constipation for several weeks with tiny hard BMs. No relief with laxative.

## 2019-07-12 ENCOUNTER — Other Ambulatory Visit: Payer: Self-pay | Admitting: Family Medicine

## 2019-07-12 DIAGNOSIS — R634 Abnormal weight loss: Secondary | ICD-10-CM

## 2019-07-22 ENCOUNTER — Ambulatory Visit
Admission: RE | Admit: 2019-07-22 | Discharge: 2019-07-22 | Disposition: A | Discharge status: Home / Self Care | Payer: Medicare Other | Source: Ambulatory Visit | Attending: Family Medicine | Admitting: Family Medicine

## 2019-07-22 DIAGNOSIS — R634 Abnormal weight loss: Secondary | ICD-10-CM

## 2019-07-22 MED ORDER — IOPAMIDOL (ISOVUE-300) INJECTION 61%
100.0000 mL | Freq: Once | INTRAVENOUS | Status: AC | PRN
  Administered 2019-07-22: 100 mL via INTRAVENOUS

## 2019-10-02 ENCOUNTER — Ambulatory Visit: Payer: Medicare Other | Admitting: Podiatry

## 2019-10-10 ENCOUNTER — Encounter (HOSPITAL_COMMUNITY): Payer: Self-pay | Admitting: Emergency Medicine

## 2019-10-10 ENCOUNTER — Emergency Department (HOSPITAL_COMMUNITY)
Admission: EM | Admit: 2019-10-10 | Discharge: 2019-10-12 | Disposition: A | Discharge status: Home / Self Care | Payer: Medicare Other | Attending: Emergency Medicine | Admitting: Emergency Medicine

## 2019-10-10 ENCOUNTER — Other Ambulatory Visit: Payer: Self-pay

## 2019-10-10 DIAGNOSIS — I10 Essential (primary) hypertension: Secondary | ICD-10-CM | POA: Diagnosis not present

## 2019-10-10 DIAGNOSIS — E039 Hypothyroidism, unspecified: Secondary | ICD-10-CM | POA: Insufficient documentation

## 2019-10-10 DIAGNOSIS — F339 Major depressive disorder, recurrent, unspecified: Secondary | ICD-10-CM | POA: Diagnosis not present

## 2019-10-10 DIAGNOSIS — Z79899 Other long term (current) drug therapy: Secondary | ICD-10-CM | POA: Diagnosis not present

## 2019-10-10 DIAGNOSIS — F3111 Bipolar disorder, current episode manic without psychotic features, mild: Secondary | ICD-10-CM | POA: Insufficient documentation

## 2019-10-10 DIAGNOSIS — F209 Schizophrenia, unspecified: Secondary | ICD-10-CM | POA: Diagnosis present

## 2019-10-10 DIAGNOSIS — F251 Schizoaffective disorder, depressive type: Secondary | ICD-10-CM | POA: Diagnosis present

## 2019-10-10 LAB — COMPREHENSIVE METABOLIC PANEL
ALT: 8 U/L (ref 0–44)
AST: 19 U/L (ref 15–41)
Albumin: 3.4 g/dL — ABNORMAL LOW (ref 3.5–5.0)
Alkaline Phosphatase: 41 U/L (ref 38–126)
Anion gap: 7 (ref 5–15)
BUN: 16 mg/dL (ref 8–23)
CO2: 27 mmol/L (ref 22–32)
Calcium: 8.9 mg/dL (ref 8.9–10.3)
Chloride: 102 mmol/L (ref 98–111)
Creatinine, Ser: 0.83 mg/dL (ref 0.44–1.00)
GFR calc Af Amer: >60 mL/min (ref >60)
GFR calc non Af Amer: >60 mL/min (ref >60)
Glucose, Bld: 104 mg/dL — ABNORMAL HIGH (ref 70–99)
Potassium: 3.5 mmol/L (ref 3.5–5.1)
Sodium: 136 mmol/L (ref 135–145)
Total Bilirubin: 0.4 mg/dL (ref 0.3–1.2)
Total Protein: 6.2 g/dL — ABNORMAL LOW (ref 6.5–8.1)

## 2019-10-10 LAB — CBC
HCT: 35 % — ABNORMAL LOW (ref 36.0–46.0)
Hemoglobin: 11.3 g/dL — ABNORMAL LOW (ref 12.0–15.0)
MCH: 32 pg (ref 26.0–34.0)
MCHC: 32.3 g/dL (ref 30.0–36.0)
MCV: 99.2 fL (ref 80.0–100.0)
Platelets: 270 10*3/uL (ref 150–400)
RBC: 3.53 MIL/uL — ABNORMAL LOW (ref 3.87–5.11)
RDW: 13.2 % (ref 11.5–15.5)
WBC: 7.3 10*3/uL (ref 4.0–10.5)
nRBC: 0 % (ref 0.0–0.2)

## 2019-10-10 LAB — RAPID URINE DRUG SCREEN, HOSP PERFORMED
Amphetamines: NOT DETECTED
Barbiturates: NOT DETECTED
Benzodiazepines: NOT DETECTED
Cocaine: NOT DETECTED
Opiates: NOT DETECTED
Tetrahydrocannabinol: NOT DETECTED

## 2019-10-10 LAB — ETHANOL: Alcohol, Ethyl (B): <10 mg/dL (ref <10)

## 2019-10-10 LAB — ACETAMINOPHEN LEVEL: Acetaminophen (Tylenol), Serum: <10 ug/mL — ABNORMAL LOW (ref 10–30)

## 2019-10-10 LAB — SALICYLATE LEVEL: Salicylate Lvl: <7 mg/dL (ref 2.8–30.0)

## 2019-10-10 NOTE — Progress Notes (Signed)
TTS calling cart, no answer  Travor Royce, MSW, LCSW Therapeutic Triage Specialist  [phone]  

## 2019-10-10 NOTE — Progress Notes (Signed)
TTS called to do assessment, Meryle, RN states she has to find telepsych cart and request TTS call back in 15 mins.  Lind Covert, MSW, LCSW Therapeutic Triage Specialist  [phone]

## 2019-10-10 NOTE — ED Triage Notes (Addendum)
Maniac episode tonight after not sleeping for 2 days  admits having auditory hallucination denies visual . Resent increase in Zyprexia.

## 2019-10-10 NOTE — ED Provider Notes (Signed)
Emergency Department Provider Note   I have reviewed the triage vital signs and the nursing notes.   HISTORY  Chief Complaint Medical Clearance and Schizophrenia   HPI Julia Huff is a 63 y.o. female who presents the emergency department today for what sounds like mania.  Patient states that she has depression but most recently she has had difficulty sleeping increasing energy and also auditory hallucinations.  She also has recently been depressed and contemplating suicide secondary to the loss of her mother earlier this year, Covid and multiple other stressors.  She states she will not kill herself because her niece and nephew need her here.  She had no recent fevers, cough, infectious symptoms.   No other associated or modifying symptoms.    Past Medical History:  Diagnosis Date  . Bipolar disorder (HCC)   . Cellulitis   . HTN (hypertension)   . Hypothyroidism   . Osteopenia   . Peripheral edema   . 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   Past Surgical History:  Procedure Laterality Date  . APPENDECTOMY  2000  . BRAIN SURGERY  2008   due to  Rt PICA aneurysm rupture  . FINGER SURGERY      Current Outpatient Rx  . Order #: [id number] Class: Historical Med  . Order #: [id number] Class: Historical Med  . Order #: [id number] Class: Historical Med  . Order #: [id number] Class: Historical Med  . Order #: [id number] Class: Historical Med  . Order #: [id number] Class: Historical Med  . Order #: [id number] Class: Historical Med  . Order #: [id number] Class: Historical Med  . Order #: [id number] Class: Historical Med  . Order #: [id number] Class: Historical Med  . Order #: [id number] Class: Historical Med  . Order #: [id number] Class: Historical Med  . Order #: [id number] Class: Print  . Order #: [id number] Class: Historical Med  .  Order #: [id number] Class: Historical Med  . Order #: [id number] Class: Historical Med    Allergies Asendin [amoxapine], Fluorescein, Lithobid [lithium], and Thorazine [chlorpromazine]  Family History  Problem Relation Age of Onset  . Lung cancer Father   . Hypertension Mother   . Dementia Mother   . Hypertension Sister   . Healthy Brother   . Healthy Sister     Social History Social History   Tobacco Use  . Smoking status: Former Games developer  . Smokeless tobacco: Never Used  . Tobacco comment: quit 2008  Substance Use Topics  . Alcohol use: Yes    Alcohol/week: 0.0 standard drinks    Comment: occasional wine with diner  . Drug use: No    Review of Systems  All other systems negative except as documented in the HPI. All pertinent positives and negatives as reviewed in the HPI. ____________________________________________   PHYSICAL EXAM:  VITAL SIGNS: ED Triage Vitals  Enc Vitals Group     BP 10/10/19 2204 132/82     Pulse Rate 10/10/19 2204 (!) 107     Resp 10/10/19 2204 16     Temp 10/10/19 2204 99 F (37.2 C)     Temp Source 10/10/19 2204 Oral     SpO2 10/10/19 2204 100 %    Constitutional: Alert and oriented. Well appearing and in no acute distress. Eyes: Conjunctivae are normal. PERRL. EOMI. Head: Atraumatic. Nose: No congestion/rhinnorhea. Mouth/Throat: Mucous membranes are moist.  Oropharynx non-erythematous. Neck: No stridor.  No meningeal signs.   Cardiovascular: Normal rate, regular rhythm. Good peripheral circulation. Grossly normal heart sounds.   Respiratory: Normal respiratory effort.  No retractions. Lungs CTAB. Gastrointestinal: Soft and nontender. No distention.  Musculoskeletal: No lower extremity tenderness nor edema. No gross deformities of extremities. Neurologic:  Normal speech and language. No gross focal neurologic deficits are appreciated.  Skin:  Skin is warm, dry and intact. No rash noted.  ____________________________________________    LABS (all labs ordered are listed, but only abnormal results are displayed)  Labs Reviewed  COMPREHENSIVE METABOLIC PANEL - Abnormal; Notable for the following components:      Result Value   Glucose, Bld 104 (*)    Total Protein 6.2 (*)    Albumin 3.4 (*)    All other components within normal limits  ACETAMINOPHEN LEVEL - Abnormal; Notable for the following components:   Acetaminophen (Tylenol), Serum <10 (*)    All other components within normal limits  CBC - Abnormal; Notable for the following components:   RBC 3.53 (*)    Hemoglobin 11.3 (*)    HCT 35.0 (*)    All other components within normal limits  ETHANOL  SALICYLATE LEVEL  RAPID URINE DRUG SCREEN, HOSP PERFORMED   ____________________________________________   INITIAL IMPRESSION / ASSESSMENT AND PLAN / ED COURSE  Sounds like bipolar with some psychotic features. Not clear if she is safe for discharge curently, will ask tts to consult, appears medically cleared for same.   tts consulted. Reassess with psychiatrist in AM.  Pertinent labs & imaging results that were available during my care of the patient were reviewed by me and considered in my medical decision making (see chart for details). ____________________________________________  FINAL CLINICAL IMPRESSION(S) / ED DIAGNOSES  Final diagnoses:  Recurrent major depressive disorder, remission status unspecified ([place])     MEDICATIONS GIVEN DURING THIS VISIT:  Medications  acetaminophen (TYLENOL) tablet 650 mg (has no administration in time range)  ondansetron (ZOFRAN) tablet 4 mg (has no administration in time range)  alum & mag hydroxide-simeth (MAALOX/MYLANTA) 200-200-20 MG/5ML suspension 30 mL (has no administration in time range)     NEW OUTPATIENT MEDICATIONS STARTED DURING THIS VISIT:  New Prescriptions   No medications on file    Note:  This note was prepared with assistance of Dragon voice recognition software. Occasional wrong-word or  sound-a-like substitutions may have occurred due to the inherent limitations of voice recognition software.   Sem Mccaughey, Corene Cornea, MD 10/11/19 [id number]

## 2019-10-11 DIAGNOSIS — F251 Schizoaffective disorder, depressive type: Secondary | ICD-10-CM | POA: Diagnosis present

## 2019-10-11 DIAGNOSIS — F339 Major depressive disorder, recurrent, unspecified: Secondary | ICD-10-CM | POA: Insufficient documentation

## 2019-10-11 LAB — URINALYSIS, COMPLETE (UACMP) WITH MICROSCOPIC
Bacteria, UA: NONE SEEN
Bilirubin Urine: NEGATIVE
Glucose, UA: NEGATIVE mg/dL
Hgb urine dipstick: NEGATIVE
Ketones, ur: NEGATIVE mg/dL
Leukocytes,Ua: NEGATIVE
Nitrite: NEGATIVE
Protein, ur: NEGATIVE mg/dL
Specific Gravity, Urine: 1.016 (ref 1.005–1.030)
pH: 6 (ref 5.0–8.0)

## 2019-10-11 LAB — VALPROIC ACID LEVEL: Valproic Acid Lvl: 53 ug/mL (ref 50.0–100.0)

## 2019-10-11 MED ORDER — TRIAMTERENE-HCTZ 37.5-25 MG PO TABS
1.0000 | ORAL_TABLET | Freq: Every day | ORAL | Status: DC
  Administered 2019-10-11 – 2019-10-12 (×2): 1 via ORAL
  Filled 2019-10-11 (×3): qty 1

## 2019-10-11 MED ORDER — OLANZAPINE 10 MG PO TABS
10.0000 mg | ORAL_TABLET | Freq: Every day | ORAL | Status: DC
  Administered 2019-10-11 (×2): 10 mg via ORAL
  Filled 2019-10-11 (×2): qty 1

## 2019-10-11 MED ORDER — ONDANSETRON HCL 4 MG PO TABS: 4.0000 mg | ORAL_TABLET | Freq: Three times a day (TID) | ORAL | Status: DC | PRN

## 2019-10-11 MED ORDER — LEVOTHYROXINE SODIUM 75 MCG PO TABS
75.0000 ug | ORAL_TABLET | Freq: Every day | ORAL | Status: DC
  Administered 2019-10-11 – 2019-10-12 (×2): 75 ug via ORAL
  Filled 2019-10-11 (×2): qty 1

## 2019-10-11 MED ORDER — ACETAMINOPHEN 325 MG PO TABS: 650.0000 mg | ORAL_TABLET | ORAL | Status: DC | PRN

## 2019-10-11 MED ORDER — POTASSIUM CHLORIDE CRYS ER 20 MEQ PO TBCR
20.0000 meq | EXTENDED_RELEASE_TABLET | Freq: Every day | ORAL | Status: DC
  Administered 2019-10-11 – 2019-10-12 (×2): 20 meq via ORAL
  Filled 2019-10-11 (×2): qty 1

## 2019-10-11 MED ORDER — DIVALPROEX SODIUM 125 MG PO CSDR
250.0000 mg | DELAYED_RELEASE_CAPSULE | Freq: Two times a day (BID) | ORAL | Status: DC
  Administered 2019-10-11 – 2019-10-12 (×4): 250 mg via ORAL
  Filled 2019-10-11 (×6): qty 2

## 2019-10-11 MED ORDER — ALUM & MAG HYDROXIDE-SIMETH 200-200-20 MG/5ML PO SUSP: 30.0000 mL | Freq: Four times a day (QID) | ORAL | Status: DC | PRN

## 2019-10-11 NOTE — Consult Note (Signed)
Telepsych Consultation   Reason for Consult:  Suicidal ideation Referring Physician:  EDP Location of Patient: [facility] Location of Provider: [facility]  Patient Identification: Julia Huff MRN:  [id number] Principal Diagnosis: Schizoaffective disorder, depressive type [facility]) Diagnosis:  Principal Problem:   Schizoaffective disorder, depressive type (HCC)   Total Time spent with patient: 30 minutes  HPI:  Per [facility] Assessment Note 10/11/2019: Julia Huff is an 63 y.o. female who presents to the ED voluntarily. Pt reports she has been experiencing AH. TTS asked the pt to describe the [facility] but she laughs and states cannot explain it because they are demons. Pt states she began to experience AH after her "prayer warrior" passed away in 19-Mar-2019. Pt states she has been having trouble sleeping and also loss of appetite. Pt states she lost 70 lbs in the past 7 months due to loss of appetite. Pt endorses passive SI but no plan. Pt states she had thoughts of hanging herself in 03-25-2020when her friend passed away but denies that she acted on it. Pt states she feels that everything got worse during the pandemic and her depression worsen. Pt presents as tangible during the assessment and makes irrelevant statements during the  Including that she wants to pay someone to clip her toenails. Pt also rambles that her brother has guns and rifles that used to belong to her grandfather. Pt states she lives with her sister whom she states is very supportive. TTS attempted to contact the pt's sister, [name] in order to obtain collateral information at [phone] but did not receive an answer.    Tele Assessment Julia Huff, 63 y.o., female patient presented to ED for evaluation of audible hallucinations.  Patient seen via telepsych by this provider; chart reviewed and consulted with Dr. Lucianne Muss on 10/11/19.  On evaluation Julia Huff reports she is feeling better today after  restarting her medications.  She continues to endorse AVH and requests to stay an additional day for continued medication efficacy. She also endorses urinary urgency, frequency and dysuria.  States she believes she has a UTI as the symptoms are familiar to her.  During the interview, the patient abruptly interjects that she needs to use the bathroom to relieve herself.  She demonstrates impulsivity but accepts redirection.  Recommend continued observation for safety and stabilization.  Can can be reassessed in the AM.   During evaluation Julia Huff is laying in bed. She is alert/oriented x 4; She is anxious but cooperative; and mood congruent with affect.  Patient is speaking in a clear tone at moderate volume, and normal pace; with good eye contact.  Her thought process is coherent and relevant; There is no indication that she is currently responding to internal/external stimuli but she continues to endorse audible hallucinations.  Patient denies suicidal/self-harm/homicidal ideation.   Patient is anxious but cooperative during assessment and has answered questions appropriately.   Past Psychiatric History:   Risk to Self: Suicidal Ideation: No-Not Currently/Within Last 6 Months Suicidal Intent: No Is patient at risk for suicide?: Yes Suicidal Plan?: No-Not Currently/Within Last 6 Months Access to Means: No What has been your use of drugs/alcohol within the last 12 months?: denies How many times?: 1 Other Self Harm Risks: psychosis Triggers for Past Attempts: Family contact Intentional Self Injurious Behavior: None Risk to Others: Homicidal Ideation: No Thoughts of Harm to Others: No Current Homicidal Intent: No Current Homicidal Plan: No Access to Homicidal Means: No History of  harm to others?: No Assessment of Violence: None Noted Does patient have access to weapons?: No Criminal Charges Pending?: No Does patient have a court date: No Prior Inpatient Therapy: Prior Inpatient  Therapy: No Prior Outpatient Therapy: Prior Outpatient Therapy: Yes Prior Therapy Dates: ongoing Prior Therapy Facilty/Provider(s): Dr. Daiva EvesWilliam J. Marshall, MD Reason for Treatment: med management Does patient have an ACCT team?: No Does patient have Intensive In-House Services?  : No Does patient have [facility] services? : No Does patient have P4CC services?: No  Past Medical History:  Past Medical History:  Diagnosis Date  . Bipolar disorder (HCC)   . Cellulitis   . HTN (hypertension)   . Hypothyroidism   . Osteopenia   . Peripheral edema   . 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 APPENDECTOMY  2000  . BRAIN SURGERY  2008   due to  Rt PICA aneurysm rupture  . FINGER SURGERY     Family History:  Family History  Problem Relation Age of Onset  . Lung cancer Father   . Hypertension Mother   . Dementia Mother   . Hypertension Sister   . Healthy Brother   . Healthy Sister    Family Psychiatric  History: unknown Social History:  Social History   Substance and Sexual Activity  Alcohol Use Yes  . Alcohol/week: 0.0 standard drinks   Comment: occasional wine with diner     Social History   Substance and Sexual Activity  Drug Use No    Social History   Socioeconomic History  . Marital status: Single    Spouse name: Not on file  . Number of children: Not on file  . Years of education: Not on file  . Highest education level: Not on file  Occupational History  . Not on file  Social Needs  . Financial resource strain: Not on file  . Food insecurity    Worry: Not on file    Inability: Not on file  . Transportation needs    Medical: Not on file    Non-medical: Not on file  Tobacco Use  . Smoking status: Former Games developermoker  . Smokeless tobacco: Never Used  . Tobacco comment: quit 2008  Substance and  Sexual Activity  . Alcohol use: Yes    Alcohol/week: 0.0 standard drinks    Comment: occasional wine with diner  . Drug use: No  . Sexual activity: Not on file  Lifestyle  . Physical activity    Days per week: Not on file    Minutes per session: Not on file  . Stress: Not on file  Relationships  . Social Musicianconnections    Talks on phone: Not on file    Gets together: Not on file    Attends religious service: Not on file    Active member of club or organization: Not on file    Attends meetings of clubs or organizations: Not on file    Relationship status: Not on file  Other Topics Concern  . Not on file  Social History Narrative  . Not on file   Additional Social History:    Allergies:   Allergies  Allergen Reactions  . Ambien [Zolpidem Tartrate] Other (See Comments)    Per sister - makes pt sleep walk, does not help pt sleep  . Asendin [Amoxapine]  unknown  . Fluorescein     Unknown   . Lithobid [Lithium]     Unknown  . Thorazine [Chlorpromazine]     Unknown     Labs:  Results for orders placed or performed during the hospital encounter of 10/10/19 (from the past 48 hour(s))  Comprehensive metabolic panel     Status: Abnormal   Collection Time: 10/10/19 10:16 PM  Result Value Ref Range   Sodium 136 135 - 145 mmol/L   Potassium 3.5 3.5 - 5.1 mmol/L   Chloride 102 98 - 111 mmol/L   CO2 27 22 - 32 mmol/L   Glucose, Bld 104 (H) 70 - 99 mg/dL   BUN 16 8 - 23 mg/dL   Creatinine, Ser 1.61 0.44 - 1.00 mg/dL   Calcium 8.9 8.9 - 09.6 mg/dL   Total Protein 6.2 (L) 6.5 - 8.1 g/dL   Albumin 3.4 (L) 3.5 - 5.0 g/dL   AST 19 15 - 41 U/L   ALT 8 0 - 44 U/L   Alkaline Phosphatase 41 38 - 126 U/L   Total Bilirubin 0.4 0.3 - 1.2 mg/dL   GFR calc non Af Amer >60 >60 mL/min   GFR calc Af Amer >60 >60 mL/min   Anion gap 7 5 - 15    Comment: Performed at [facility], [address], [place], [place] [address]  Ethanol     Status: None   Collection Time:  10/10/19 10:16 PM  Result Value Ref Range   Alcohol, Ethyl (B) <10 <10 mg/dL    Comment: (NOTE) Lowest detectable limit for serum alcohol is 10 mg/dL. For medical purposes only. Performed at [facility], [address], [place], [place] [address]   Salicylate level     Status: None   Collection Time: 10/10/19 10:16 PM  Result Value Ref Range   Salicylate Lvl <7.0 2.8 - 30.0 mg/dL    Comment: Performed at [facility], [address] [place], [place] [address]  Acetaminophen level     Status: Abnormal   Collection Time: 10/10/19 10:16 PM  Result Value Ref Range   Acetaminophen (Tylenol), Serum <10 (L) 10 - 30 ug/mL    Comment: (NOTE) Therapeutic concentrations vary significantly. A range of 10-30 ug/mL  may be an effective concentration for many patients. However, some  are best treated at concentrations outside of this range. Acetaminophen concentrations >150 ug/mL at 4 hours after ingestion  and >50 ug/mL at 12 hours after ingestion are often associated with  toxic reactions. Performed at [facility], [address] [place], [place] [address]   cbc     Status: Abnormal   Collection Time: 10/10/19 10:16 PM  Result Value Ref Range   WBC 7.3 4.0 - 10.5 K/uL   RBC 3.53 (L) 3.87 - 5.11 MIL/uL   Hemoglobin 11.3 (L) 12.0 - 15.0 g/dL   HCT 30.8 (L) 65.7 - 84.6 %   MCV 99.2 80.0 - 100.0 fL   MCH 32.0 26.0 - 34.0 pg   MCHC 32.3 30.0 - 36.0 g/dL   RDW 96.2 95.2 - 84.1 %   Platelets 270 150 - 400 K/uL   nRBC 0.0 0.0 - 0.2 %    Comment: Performed at [facility], [address] [place], [place] [address]  Rapid urine drug screen (hospital performed)     Status: None   Collection Time: 10/10/19 10:16 PM  Result Value Ref Range   Opiates NONE  DETECTED NONE DETECTED   Cocaine NONE DETECTED NONE DETECTED   Benzodiazepines NONE DETECTED NONE DETECTED   Amphetamines NONE DETECTED NONE DETECTED    Tetrahydrocannabinol NONE DETECTED NONE DETECTED   Barbiturates NONE DETECTED NONE DETECTED    Comment: (NOTE) DRUG SCREEN FOR MEDICAL PURPOSES ONLY.  IF CONFIRMATION IS NEEDED FOR ANY PURPOSE, NOTIFY LAB WITHIN 5 DAYS. LOWEST DETECTABLE LIMITS FOR URINE DRUG SCREEN Drug Class                     Cutoff (ng/mL) Amphetamine and metabolites    1000 Barbiturate and metabolites    200 Benzodiazepine                 200 Tricyclics and metabolites     300 Opiates and metabolites        300 Cocaine and metabolites        300 THC                            50 Performed at [facility], [address], [place], [place] [address]   Valproic acid level     Status: None   Collection Time: 10/10/19 10:16 PM  Result Value Ref Range   Valproic Acid Lvl 53 50.0 - 100.0 ug/mL    Comment: Performed at [facility], [address], [place], [place] [address]  Urinalysis, Complete w Microscopic     Status: None   Collection Time: 10/11/19 11:35 AM  Result Value Ref Range   Color, Urine YELLOW YELLOW   APPearance CLEAR CLEAR   Specific Gravity, Urine 1.016 1.005 - 1.030   pH 6.0 5.0 - 8.0   Glucose, UA NEGATIVE NEGATIVE mg/dL   Hgb urine dipstick NEGATIVE NEGATIVE   Bilirubin Urine NEGATIVE NEGATIVE   Ketones, ur NEGATIVE NEGATIVE mg/dL   Protein, ur NEGATIVE NEGATIVE mg/dL   Nitrite NEGATIVE NEGATIVE   Leukocytes,Ua NEGATIVE NEGATIVE   RBC / HPF 0-5 0 - 5 RBC/hpf   WBC, UA 0-5 0 - 5 WBC/hpf   Bacteria, UA NONE SEEN NONE SEEN    Comment: Performed at [facility], [address], [place], [place] [address]    Medications:  Current Facility-Administered Medications  Medication Dose Route Frequency Provider Last Rate Last Dose  . acetaminophen (TYLENOL) tablet 650 mg  650 mg Oral Q4H PRN Mesner, Barbara Cower, MD      . alum & mag hydroxide-simeth (MAALOX/MYLANTA) 200-200-20 MG/5ML suspension 30 mL  30 mL Oral Q6H PRN Mesner, Barbara Cower, MD       . divalproex (DEPAKOTE SPRINKLE) capsule 250 mg  250 mg Oral BID Mesner, Barbara Cower, MD   250 mg at 10/11/19 1144  . levothyroxine (SYNTHROID) tablet 75 mcg  75 mcg Oral Daily Mesner, Jason, MD   75 mcg at 10/11/19 0606  . OLANZapine (ZYPREXA) tablet 10 mg  10 mg Oral QHS Mesner, Barbara Cower, MD   10 mg at 10/11/19 0247  . ondansetron (ZOFRAN) tablet 4 mg  4 mg Oral Q8H PRN Mesner, Barbara Cower, MD      . potassium chloride SA (KLOR-CON) CR tablet 20 mEq  20 mEq Oral Daily Mesner, Jason, MD   20 mEq at 10/11/19 1145  . triamterene-hydrochlorothiazide (MAXZIDE-25) 37.5-25 MG per tablet 1 tablet  1 tablet Oral Daily Mesner, Jason, MD   1 tablet at 10/11/19 1143   Current Outpatient Medications  Medication Sig Dispense Refill  . acetaminophen (  TYLENOL) 500 MG tablet Take 500 mg by mouth every 6 (six) hours as needed for mild pain.    . ARIPiprazole (ABILIFY) 5 MG tablet Take 5 mg by mouth daily.  0  . Cholecalciferol (VITAMIN D) 2000 units tablet Take 4,000 Units by mouth daily.     . divalproex (DEPAKOTE SPRINKLE) 125 MG capsule Take 250 mg by mouth 2 (two) times daily.  0  . levothyroxine (SYNTHROID, LEVOTHROID) 75 MCG tablet Take 75 mcg by mouth daily.  0  . Melatonin 5 MG TABS Take 5 mg by mouth at bedtime as needed (sleep).    . Multiple Vitamins-Minerals (HAIR SKIN AND NAILS FORMULA) TABS Take 1 tablet by mouth daily.    . Multiple Vitamins-Minerals (MULTIVITAMIN GUMMIES ADULT) CHEW Chew 2 tablets by mouth daily.    . nitrofurantoin, macrocrystal-monohydrate, (MACROBID) 100 MG capsule Take 100 mg by mouth 2 (two) times daily.    Marland Kitchen OLANZapine (ZYPREXA) 5 MG tablet Take 5-10 mg by mouth See admin instructions. Take one tablet in the morning, take two tablets in the evening.    . polyethylene glycol powder (GLYCOLAX/MIRALAX) 17 GM/SCOOP powder Take 17 g by mouth 2 (two) times daily. 255 g 0  . potassium chloride (K-DUR) 10 MEQ tablet Take 10 mEq by mouth daily.   1  . triamterene-hydrochlorothiazide  (MAXZIDE-25) 37.5-25 MG tablet Take 1 tablet by mouth daily.       Musculoskeletal: Unable to assess via telepsych  Psychiatric Specialty Exam: Physical Exam  Constitutional: She is oriented to person, place, and time. She appears well-developed.  HENT:  Head: Normocephalic.  Eyes: Pupils are equal, round, and reactive to light.  Neck: Normal range of motion.  Cardiovascular: Normal rate.  Respiratory: Effort normal.  Musculoskeletal: Normal range of motion.  Neurological: She is alert and oriented to person, place, and time.    Review of Systems  Genitourinary: Positive for dysuria and frequency.  Psychiatric/Behavioral: Positive for hallucinations (endorses AVH). The patient has insomnia.     Blood pressure (!) 147/81, pulse 96, temperature 97.7 F (36.5 C), temperature source Oral, resp. rate 18, SpO2 100 %.There is no height or weight on file to calculate BMI.  General Appearance: Casual  Eye Contact:  Good  Speech:  Normal Rate  Volume:  Normal  Mood:  Anxious and Depressed  Affect:  Congruent  Thought Process:  Coherent  Orientation:  Full (Time, Place, and Person)  Thought Content:  Logical  Suicidal Thoughts:  No  Homicidal Thoughts:  No  Memory:  Immediate;   Fair Recent;   Good Remote;   Good  Judgement:  Fair  Insight:  Fair  Psychomotor Activity:  Normal  Concentration:  Concentration: Fair and Attention Span: Fair  Recall:  Good  Fund of Knowledge:  Good  Language:  Good  Akathisia:  No  Handed:  Right  AIMS (if indicated):     Assets:  Desire for Improvement Resilience  ADL's:  Intact  Cognition:  Impaired,  Mild  Sleep:   impaired   Treatment Plan Summary: Recommend inpatient admission to continue to assess for safety and medication efficacy. Can be reassess in 24 hours for discharge.   Daily contact with patient to assess and evaluate symptoms and progress in treatment and Medication management  Start: Mood Stabilization: Divalproex 250mg  po  bid  Psychosis Olanzapine 10mg  po daily, hs  Disposition: Recommend psychiatric Inpatient admission when medically cleared. Supportive therapy provided about ongoing stressors.  This service was provided via telemedicine  using a 2-way, interactive audio and Immunologist.  Names of all persons participating in this telemedicine service and their role in this encounter. Name: Ophelia Shoulder Role: PMHNP  Name: Thedore Mins Role: Psychiatrist  Name: Corliss Marcus Role: Patient   Chales Abrahams, NP 10/11/2019 8:19 PM

## 2019-10-11 NOTE — ED Notes (Signed)
Patient occasionally wandering out of her room.  She has been easy to redirect and in a pleasant mood today.  She enjoys talking with staff but does seem to be confused at times.  Ambulates on her own without assistance but does so slowly.  Taking her medications without difficulty.  Potassium needs to be crushed and placed in pudding or applesauce.

## 2019-10-11 NOTE — ED Notes (Signed)
Patient asked RN to speak to pt's other sister on the phone. RN updated sister on patient status.
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
## 2019-10-11 NOTE — ED Notes (Signed)
Pt has one belonging bag placed in cabinet behind nursing station for rooms 13[address]18

## 2019-10-11 NOTE — ED Notes (Signed)
Pt repeatedly asking RN for phone to call different people. Pt gives handwritten notes to RN and asked to pass to certain people she knows. RN informed pt that those notes can't be sent out but will be kept at nurses station, pt agreed. Pt keeps trying to walk out of room, but is pleasant and cooperative when asked to step back into room.

## 2019-10-11 NOTE — ED Notes (Signed)
Pt asked to call sister, and pt asked RN to speak to sister. Sister stated to RN "she doesn't sleep at home, she will stay up for days with no sleep, we can't do that anymore." Pt on the phone with sister at this time.

## 2019-10-11 NOTE — Progress Notes (Signed)
TTS calling cart, no answer. 

## 2019-10-11 NOTE — ED Notes (Signed)
Patient ambulated with nurse to bathroom

## 2019-10-11 NOTE — ED Provider Notes (Signed)
Vitals:   10/11/19 0606 10/11/19 1142  BP: (!) 139/97 (!) 145/91  Pulse: 90 93  Resp: 17 18  Temp:  97.6 F (36.4 C)  SpO2: 100% 100%   Labs reviewed.  Patient is stable pending psychiatric reassessment.   Dorie Rank, MD 10/11/19 1229

## 2019-10-11 NOTE — ED Notes (Signed)
Patient demanding an article of clothing from her locker.  When nurse explained the policy in TCU, patient became agitated and asked to see the head of the hospital.

## 2019-10-11 NOTE — Progress Notes (Signed)
Per Lindon Romp, NP pt is recommended for continued observation for safety and stabilization and to be reassessed in the AM by psych. Pt's nurse Kerin Ransom, RN and EDP Mesner, Corene Cornea, MD have been advised.  Lind Covert, MSW, LCSW Therapeutic Triage Specialist  [phone]

## 2019-10-12 MED ORDER — DIVALPROEX SODIUM 125 MG PO CSDR: 250.0000 mg | DELAYED_RELEASE_CAPSULE | Freq: Two times a day (BID) | ORAL | 0 refills | Status: AC

## 2019-10-12 MED ORDER — OLANZAPINE 10 MG PO TABS: 10.0000 mg | ORAL_TABLET | Freq: Every day | ORAL | 0 refills | Status: DC

## 2019-10-12 MED ORDER — LORAZEPAM 0.5 MG PO TABS
0.5000 mg | ORAL_TABLET | Freq: Three times a day (TID) | ORAL | Status: DC | PRN
  Administered 2019-10-12: 0.5 mg via ORAL
  Filled 2019-10-12: qty 1

## 2019-10-12 NOTE — ED Notes (Signed)
Disregard previous dispo/discharge note. Wrong chart.

## 2019-10-12 NOTE — ED Notes (Signed)
Patient is resting comfortably. Bed with eyes closed.

## 2019-10-12 NOTE — ED Notes (Signed)
Patient's sister called for discharge transport

## 2019-10-12 NOTE — ED Provider Notes (Signed)
Vitals:   10/11/19 2302 10/12/19 0613  BP: (!) 129/93 115/80  Pulse: 98 (!) 108  Resp: 18 17  Temp: 98 F (36.7 C) 97.8 F (36.6 C)  SpO2: 100% 100%   Medically stable.  Awaiting psych disposition.   Dorie Rank, MD 10/12/19 1128

## 2019-10-12 NOTE — Consult Note (Addendum)
Telepsych Consultation   Reason for Consult:  Hallucinations Referring Physician:  EDP Location of Patient: [facility]  Patient Identification: Pecolia AdesCheryl A Hird MRN:  [id number] Principal Diagnosis: Schizoaffective disorder, depressive type [facility](HCC) Diagnosis:  Principal Problem:   Schizoaffective disorder, depressive type (HCC)   Total Time spent with patient: 30 minutes  HPI:  [facility] 10/11/2019: Phillips Odorheryl A Wilsonis an 63 y.o.femalewho presents to the ED voluntarily.Pt reports she has been experiencing AH. TTS asked the pt to describe the [facility] but she laughs and states cannot explain it because they are demons. Pt states she began to experience AH after her "prayer warrior" passed away in March 2020. Pt states she has been having trouble sleeping and also loss of appetite. Pt states she lost 70 lbs in the past 7 months due to loss of appetite. Pt endorses passive SI but no plan. Pt states she had thoughts of hanging herself in March 2020 when her friend passed away but denies that she acted on it. Pt states she feels that everything got worse during the pandemic and her depression worsen. Pt presents as tangible during the assessment and makes irrelevant statements during the Including that she wants to pay someone to clip her toenails. Pt also rambles that her brother has guns and rifles that used to belong to her grandfather. Pt states she lives with her sister whom she states is very supportive. TTS attempted to contact the pt's sister, Clelia SchaumannDenise Henderson in order to obtain collateral information at [phone] but did not receive an answer.   Tele Assessment Note dated 10/11/2019: Pecolia Adesheryl A Supple, 63 y.o., female patient presented to ED for evaluation of audible hallucinations.  Patient seen via telepsych by this provider; chart reviewed and consulted with Dr. Lucianne MussKumar on 10/11/19.  On evaluation Pecolia AdesCheryl A Villafranca reports she is  feeling better today after restarting her medications.  She continues to endorse AVH and requests to stay an additional day for continued medication efficacy. She also endorses urinary urgency, frequency and dysuria.  States she believes she has a UTI as the symptoms are familiar to her.  During the interview, the patient abruptly interjects that she needs to use the bathroom to relieve herself.  She demonstrates impulsivity but accepts redirection.  Recommend continued observation for safety and stabilization.  Can can be reassessed in the AM.   During evaluation Pecolia Adesheryl A Blatchford is laying in bed. She is alert/oriented x 4; She is anxious but cooperative; and mood congruent with affect.  Patient is speaking in a clear tone at moderate volume, and normal pace; with good eye contact.  Her thought process is coherent and relevant; There is no indication that she is currently responding to internal/external stimuli but she continues to endorse audible hallucinations.  Patient denies suicidal/self-harm/homicidal ideation.   Patient is anxious but cooperative during assessment and has answered questions appropriately.   Tele Assessment 10/12/2019: She reports doing well today and states she is ready to go home.   During evaluation Pecolia AdesCheryl A Alred is seated position on the bed. She is alert/oriented x 4; calm/cooperative; and mood congruent with affect.  Patient is speaking in a clear tone at moderate volume, and normal pace; with good eye contact.  Her thought process is coherent and relevant; There is no indication that she is currently responding to internal/external stimuli or experiencing delusional thought content.  Patient denies suicidal/self-harm/homicidal ideation, psychosis, and paranoia.  Patient has remained calm throughout assessment and  has answered questions appropriately.  Recommend discharge home at this time with follow-up with community mental health resources.   Past Psychiatric History:  schizoaffective disorder, depressive type.   Risk to Self: Suicidal Ideation: No-Not Currently/Within Last 6 Months Suicidal Intent: No Is patient at risk for suicide?: Yes Suicidal Plan?: No-Not Currently/Within Last 6 Months Access to Means: No What has been your use of drugs/alcohol within the last 12 months?: denies How many times?: 1 Other Self Harm Risks: psychosis Triggers for Past Attempts: Family contact Intentional Self Injurious Behavior: None Risk to Others: Homicidal Ideation: No Thoughts of Harm to Others: No Current Homicidal Intent: No Current Homicidal Plan: No Access to Homicidal Means: No History of harm to others?: No Assessment of Violence: None Noted Does patient have access to weapons?: No Criminal Charges Pending?: No Does patient have a court date: No Prior Inpatient Therapy: Prior Inpatient Therapy: No Prior Outpatient Therapy: Prior Outpatient Therapy: Yes Prior Therapy Dates: ongoing Prior Therapy Facilty/Provider(s): Dr. Daiva Eves, MD Reason for Treatment: med management Does patient have an ACCT team?: No Does patient have Intensive In-House Services?  : No Does patient have [facility] services? : No Does patient have P4CC services?: No  Past Medical History:  Past Medical History:  Diagnosis Date  . Bipolar disorder (HCC)   . Cellulitis   . HTN (hypertension)   . Hypothyroidism   . Osteopenia   . Peripheral edema   . 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 APPENDECTOMY  2000  . BRAIN SURGERY  2008   due to  Rt PICA aneurysm rupture  . FINGER SURGERY     Family History:  Family History  Problem Relation Age of Onset  . Lung cancer Father   . Hypertension Mother   . Dementia Mother   . Hypertension Sister   . Healthy Brother   . Healthy Sister    Family  Psychiatric  History: unknown Social History:  Social History   Substance and Sexual Activity  Alcohol Use Yes  . Alcohol/week: 0.0 standard drinks   Comment: occasional wine with diner     Social History   Substance and Sexual Activity  Drug Use No    Social History   Socioeconomic History  . Marital status: Single    Spouse name: Not on file  . Number of children: Not on file  . Years of education: Not on file  . Highest education level: Not on file  Occupational History  . Not on file  Social Needs  . Financial resource strain: Not on file  . Food insecurity    Worry: Not on file    Inability: Not on file  . Transportation needs    Medical: Not on file    Non-medical: Not on file  Tobacco Use  . Smoking status: Former Games developer  . Smokeless tobacco: Never Used  . Tobacco comment: quit 2008  Substance and Sexual Activity  . Alcohol use: Yes    Alcohol/week: 0.0 standard drinks    Comment: occasional wine with diner  . Drug use: No  . Sexual activity: Not on file  Lifestyle  . Physical activity    Days per week: Not on file    Minutes per session: Not on file  . Stress: Not on file  Relationships  . Social connections  Talks on phone: Not on file    Gets together: Not on file    Attends religious service: Not on file    Active member of club or organization: Not on file    Attends meetings of clubs or organizations: Not on file    Relationship status: Not on file  Other Topics Concern  . Not on file  Social History Narrative  . Not on file   Additional Social History:    Allergies:   Allergies  Allergen Reactions  . Ambien [Zolpidem Tartrate] Other (See Comments)    Per sister - makes pt sleep walk, does not help pt sleep  . Asendin [Amoxapine]     unknown  . Fluorescein     Unknown   . Lithobid [Lithium]     Unknown  . Thorazine [Chlorpromazine]     Unknown     Labs:  Results for orders placed or performed during the hospital encounter  of 10/10/19 (from the past 48 hour(s))  Comprehensive metabolic panel     Status: Abnormal   Collection Time: 10/10/19 10:16 PM  Result Value Ref Range   Sodium 136 135 - 145 mmol/L   Potassium 3.5 3.5 - 5.1 mmol/L   Chloride 102 98 - 111 mmol/L   CO2 27 22 - 32 mmol/L   Glucose, Bld 104 (H) 70 - 99 mg/dL   BUN 16 8 - 23 mg/dL   Creatinine, Ser 0.83 0.44 - 1.00 mg/dL   Calcium 8.9 8.9 - 10.3 mg/dL   Total Protein 6.2 (L) 6.5 - 8.1 g/dL   Albumin 3.4 (L) 3.5 - 5.0 g/dL   AST 19 15 - 41 U/L   ALT 8 0 - 44 U/L   Alkaline Phosphatase 41 38 - 126 U/L   Total Bilirubin 0.4 0.3 - 1.2 mg/dL   GFR calc non Af Amer >60 >60 mL/min   GFR calc Af Amer >60 >60 mL/min   Anion gap 7 5 - 15    Comment: Performed at [facility], [place] [address] [place], [place] [address]  Ethanol     Status: None   Collection Time: 10/10/19 10:16 PM  Result Value Ref Range   Alcohol, Ethyl (B) <10 <10 mg/dL    Comment: (NOTE) Lowest detectable limit for serum alcohol is 10 mg/dL. For medical purposes only. Performed at [facility], [place] [address], [place], [place] [address]   Salicylate level     Status: None   Collection Time: 10/10/19 10:16 PM  Result Value Ref Range   Salicylate Lvl <3.5 2.8 - 30.0 mg/dL    Comment: Performed at [facility], [place] [address] [place], [place] [address]  Acetaminophen level     Status: Abnormal   Collection Time: 10/10/19 10:16 PM  Result Value Ref Range   Acetaminophen (Tylenol), Serum <10 (L) 10 - 30 ug/mL    Comment: (NOTE) Therapeutic concentrations vary significantly. A range of 10-30 ug/mL  may be an effective concentration for many patients. However, some  are best treated at concentrations outside of this range. Acetaminophen concentrations >150 ug/mL at 4 hours after ingestion  and >50 ug/mL at 12 hours after ingestion are often associated with  toxic reactions. Performed at [facility], [place] [address], [place], [place] [address]   cbc     Status: Abnormal   Collection Time: 10/10/19 10:16 PM  Result Value Ref Range   WBC 7.3 4.0 - 10.5 K/uL  RBC 3.53 (L) 3.87 - 5.11 MIL/uL   Hemoglobin 11.3 (L) 12.0 - 15.0 g/dL   HCT 40.9 (L) 81.1 - 91.4 %   MCV 99.2 80.0 - 100.0 fL   MCH 32.0 26.0 - 34.0 pg   MCHC 32.3 30.0 - 36.0 g/dL   RDW 78.2 95.6 - 21.3 %   Platelets 270 150 - 400 K/uL   nRBC 0.0 0.0 - 0.2 %    Comment: Performed at [facility], [address], [place], [place] [address]  Rapid urine drug screen (hospital performed)     Status: None   Collection Time: 10/10/19 10:16 PM  Result Value Ref Range   Opiates NONE DETECTED NONE DETECTED   Cocaine NONE DETECTED NONE DETECTED   Benzodiazepines NONE DETECTED NONE DETECTED   Amphetamines NONE DETECTED NONE DETECTED   Tetrahydrocannabinol NONE DETECTED NONE DETECTED   Barbiturates NONE DETECTED NONE DETECTED    Comment: (NOTE) DRUG SCREEN FOR MEDICAL PURPOSES ONLY.  IF CONFIRMATION IS NEEDED FOR ANY PURPOSE, NOTIFY LAB WITHIN 5 DAYS. LOWEST DETECTABLE LIMITS FOR URINE DRUG SCREEN Drug Class                     Cutoff (ng/mL) Amphetamine and metabolites    1000 Barbiturate and metabolites    200 Benzodiazepine                 200 Tricyclics and metabolites     300 Opiates and metabolites        300 Cocaine and metabolites        300 THC                            50 Performed at [facility], [address], [place], [place] [address]   Valproic acid level     Status: None   Collection Time: 10/10/19 10:16 PM  Result Value Ref Range   Valproic Acid Lvl 53 50.0 - 100.0 ug/mL    Comment: Performed at [facility], [address], [place], [place] [address]  Urinalysis, Complete w Microscopic     Status: None   Collection Time: 10/11/19 11:35 AM  Result Value Ref Range   Color, Urine YELLOW YELLOW   APPearance CLEAR CLEAR   Specific  Gravity, Urine 1.016 1.005 - 1.030   pH 6.0 5.0 - 8.0   Glucose, UA NEGATIVE NEGATIVE mg/dL   Hgb urine dipstick NEGATIVE NEGATIVE   Bilirubin Urine NEGATIVE NEGATIVE   Ketones, ur NEGATIVE NEGATIVE mg/dL   Protein, ur NEGATIVE NEGATIVE mg/dL   Nitrite NEGATIVE NEGATIVE   Leukocytes,Ua NEGATIVE NEGATIVE   RBC / HPF 0-5 0 - 5 RBC/hpf   WBC, UA 0-5 0 - 5 WBC/hpf   Bacteria, UA NONE SEEN NONE SEEN    Comment: Performed at [facility], [address], [place], [place] [address]    Medications:  Current Facility-Administered Medications  Medication Dose Route Frequency Provider Last Rate Last Dose  . acetaminophen (TYLENOL) tablet 650 mg  650 mg Oral Q4H PRN Mesner, Barbara Cower, MD      . alum & mag hydroxide-simeth (MAALOX/MYLANTA) 200-200-20 MG/5ML suspension 30 mL  30 mL Oral Q6H PRN Mesner, Barbara Cower, MD      . divalproex (DEPAKOTE SPRINKLE) capsule 250 mg  250 mg Oral BID Mesner, Barbara Cower, MD   250 mg at 10/12/19 1006  . levothyroxine (SYNTHROID) tablet 75 mcg  75  mcg Oral Daily Mesner, Barbara Cower, MD   75 mcg at 10/12/19 0533  . LORazepam (ATIVAN) tablet 0.5 mg  0.5 mg Oral TID PRN Linwood Dibbles, MD   0.5 mg at 10/12/19 1000  . OLANZapine (ZYPREXA) tablet 10 mg  10 mg Oral QHS Mesner, Barbara Cower, MD   10 mg at 10/11/19 2110  . ondansetron (ZOFRAN) tablet 4 mg  4 mg Oral Q8H PRN Mesner, Barbara Cower, MD      . potassium chloride SA (KLOR-CON) CR tablet 20 mEq  20 mEq Oral Daily Mesner, Jason, MD   20 mEq at 10/12/19 1006  . triamterene-hydrochlorothiazide (MAXZIDE-25) 37.5-25 MG per tablet 1 tablet  1 tablet Oral Daily Mesner, Jason, MD   1 tablet at 10/12/19 1006   Current Outpatient Medications  Medication Sig Dispense Refill  . acetaminophen (TYLENOL) 500 MG tablet Take 500 mg by mouth every 6 (six) hours as needed for mild pain.    . Cholecalciferol (VITAMIN D) 2000 units tablet Take 4,000 Units by mouth daily.     Marland Kitchen levothyroxine (SYNTHROID, LEVOTHROID) 75 MCG tablet Take 75 mcg by mouth  daily.  0  . Multiple Vitamins-Minerals (MULTIVITAMIN GUMMIES ADULT) CHEW Chew 2 tablets by mouth daily.    . nitrofurantoin, macrocrystal-monohydrate, (MACROBID) 100 MG capsule Take 100 mg by mouth 2 (two) times daily.    Marland Kitchen OLANZapine (ZYPREXA) 5 MG tablet Take 5-10 mg by mouth See admin instructions. Take one tablet in the morning, take two tablets in the evening.    . potassium chloride (K-DUR) 10 MEQ tablet Take 10 mEq by mouth daily.   1  . triamterene-hydrochlorothiazide (MAXZIDE-25) 37.5-25 MG tablet Take 1 tablet by mouth daily.     . divalproex (DEPAKOTE SPRINKLE) 125 MG capsule Take 2 capsules (250 mg total) by mouth 2 (two) times daily. 30 capsule 0  . OLANZapine (ZYPREXA) 10 MG tablet Take 1 tablet (10 mg total) by mouth at bedtime. 30 tablet 0    Musculoskeletal: Unable to assess via telepsych  Psychiatric Specialty Exam: Physical Exam  Constitutional: She is oriented to person, place, and time. She appears well-developed.  HENT:  Head: Normocephalic.  Eyes: Pupils are equal, round, and reactive to light.  Neck: Normal range of motion.  Cardiovascular: Normal rate.  Respiratory: Effort normal.  Musculoskeletal: Normal range of motion.  Neurological: She is alert and oriented to person, place, and time.  Psychiatric: She has a normal mood and affect. Her behavior is normal. Judgment and thought content normal.    Review of Systems  Psychiatric/Behavioral: Negative for depression, hallucinations (present on admissions ), substance abuse and suicidal ideas. The patient is nervous/anxious. The patient does not have insomnia.     Blood pressure 110/87, pulse 100, temperature 98.1 F (36.7 C), temperature source Oral, resp. rate 16, SpO2 100 %.There is no height or weight on file to calculate BMI.  General Appearance: Casual  Eye Contact:  Good  Speech:  Clear and Coherent  Volume:  Normal  Mood:  Euthymic  Affect:  Congruent and Restricted  Thought Process:  Coherent   Orientation:  Full (Time, Place, and Person)  Thought Content:  Logical  Suicidal Thoughts:  No  Homicidal Thoughts:  No  Memory:  Immediate;   Good Recent;   Good Remote;   Good  Judgement:  Good  Insight:  Fair  Psychomotor Activity:  Normal  Concentration:  Concentration: Good and Attention Span: Good  Recall:  Good  Fund of Knowledge:  Good  Language:  Good  Akathisia:  No  Handed:  Right  AIMS (if indicated):     Assets:  Desire for Improvement Resilience  ADL's:  Intact  Cognition:  WNL  Sleep:   >6 hours     Treatment Plan Summary: Discharge home.  The patient appears reasonably screened and/or stabilized for discharge and does not appear to have emergency medical/psychiatric concerns/conditions requiring further screening, evaluation, or treatment at this time prior to discharge.   Plan is to discharge home with recommendations to follow-up with community mental health provider.  Disposition: No evidence of imminent risk to self or others at present.   Patient does not meet criteria for psychiatric inpatient admission. Supportive therapy provided about ongoing stressors. Discussed crisis plan, support from social network, calling 911, coming to the Emergency Department, and calling Suicide Hotline.   Recommendations: 1. Take all of you medications as prescribed by your mental healthcare provider.  Report any adverse effects and reactions from your medications to your outpatient provider promptly.   2. Do not engage in alcohol and or illegal drug use while on prescription medicines. Keep all scheduled appointments. This is to ensure that you are getting refills on time and to avoid any interruption in your medication.  If you are unable to keep an appointment call to reschedule.   3. Be sure to follow up with resources and follow ups given. 4. In the event of worsening symptoms call the crisis hotline, 911, and or go to the nearest emergency department for appropriate  evaluation and treatment of symptoms. 5. Follow-up with your primary care provider for your medical issues, concerns and or health care needs.     This service was provided via telemedicine using a 2-way, interactive audio and video technology.  Names of all persons participating in this telemedicine service and their role in this encounter. Name: Ophelia Shoulder Role: PMHNP  Name: Thedore Mins Role: Psychiatrist  Name: Corliss Marcus Role: Patient    Chales Abrahams, NP 10/12/2019 3:50 PM  Patient seen face-to-face for psychiatric evaluation, chart reviewed and case discussed with the physician extender and developed treatment plan. Reviewed the information documented and agree with the treatment plan. Thedore Mins, MD

## 2019-12-12 ENCOUNTER — Ambulatory Visit (INDEPENDENT_AMBULATORY_CARE_PROVIDER_SITE_OTHER): Payer: Medicare Other | Admitting: Podiatry

## 2019-12-12 ENCOUNTER — Other Ambulatory Visit: Payer: Self-pay

## 2019-12-12 DIAGNOSIS — G5763 Lesion of plantar nerve, bilateral lower limbs: Secondary | ICD-10-CM | POA: Diagnosis not present

## 2019-12-28 NOTE — Progress Notes (Signed)
Subjective:  Patient ID: Julia Huff, female    DOB: [birth year],  MRN: [id number]  Chief Complaint  Patient presents with  . Neuroma    Pt states she is still painful and numb on the top of bilateral feet, would like to discuss further injections.    64 y.o. female presents with the above complaint.  History above confirmed with patient  Review of Systems: Negative except as noted in the HPI. Denies N/V/F/Ch.  Past Medical History:  Diagnosis Date  . Bipolar disorder ([place])   . Cellulitis   . HTN (hypertension)   . Hypothyroidism   . Osteopenia   . Peripheral edema   . 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  acetaminophen (TYLENOL) 500 MG tablet, Take 500 mg by mouth every 6 (six) hours as needed for mild pain., Disp: , Rfl:  .  ARIPiprazole (ABILIFY) 5 MG tablet, Take 5 mg by mouth every morning., Disp: , Rfl:  .  Cholecalciferol (VITAMIN D) 2000 units tablet, Take 4,000 Units by mouth daily. , Disp: , Rfl:  .  divalproex (DEPAKOTE SPRINKLE) 125 MG capsule, Take 2 capsules (250 mg total) by mouth 2 (two) times daily., Disp: 30 capsule, Rfl: 0 .  levothyroxine (SYNTHROID, LEVOTHROID) 75 MCG tablet, Take 75 mcg by mouth daily., Disp: , Rfl: 0 .  Multiple Vitamins-Minerals (MULTIVITAMIN GUMMIES ADULT) CHEW, Chew 2 tablets by mouth daily., Disp: , Rfl:  .  nitrofurantoin, macrocrystal-monohydrate, (MACROBID) 100 MG capsule, Take 100 mg by mouth 2 (two) times daily., Disp: , Rfl:  .  OLANZapine (ZYPREXA) 10 MG tablet, Take 1 tablet (10 mg total) by mouth at bedtime., Disp: 30 tablet, Rfl: 0 .  OLANZapine (ZYPREXA) 5 MG tablet, Take 5-10 mg by mouth See admin instructions. Take one tablet in the morning, take two tablets in the evening., Disp: , Rfl:  .  polyethylene glycol-electrolytes (NULYTELY/GOLYTELY) 420 g solution, MIX AND DRINK  UTD, Disp: , Rfl:  .  potassium chloride (K-DUR) 10 MEQ tablet, Take 10 mEq by mouth daily. , Disp: , Rfl: 1 .  potassium chloride (KLOR-CON) 10 MEQ tablet, Take 10 mEq by mouth daily., Disp: , Rfl:  .  triamterene-hydrochlorothiazide (MAXZIDE-25) 37.5-25 MG tablet, Take 1 tablet by mouth daily. , Disp: , Rfl:   Social History   Tobacco Use  Smoking Status Former Smoker  Smokeless Tobacco Never Used  Tobacco Comment   quit 2008    Allergies  Allergen Reactions  . Ambien [Zolpidem Tartrate] Other (See Comments)    Per sister - makes pt sleep walk, does not help pt sleep  . Asendin [Amoxapine]     unknown  . Fluorescein     Unknown   . Lithobid [Lithium]     Unknown  . Thorazine [Chlorpromazine]     Unknown    Objective:   There were no vitals filed for this visit. There is no height or weight on file to calculate BMI. Constitutional Well developed. Well nourished.  Vascular Dorsalis pedis pulses palpable bilaterally. Posterior tibial pulses palpable bilaterally. Capillary refill normal to all digits.  No cyanosis or clubbing noted. Pedal hair growth normal.  Neurologic Normal speech. Oriented to person, place, and time. Epicritic sensation to light touch grossly present bilaterally.  Dermatologic Nails well groomed and normal in appearance. No open wounds. No skin lesions.  Orthopedic: POP 3rd Interspace  Bilat with Mulder's click left Hammertoe deformities noted bilaterally   Assessment:   1. Morton's neuroma of both feet    Plan:  Patient was evaluated and treated and all questions answered.  Interdigital Neuroma, bilaterally -Repeat injection as below  Procedure: Neuroma Injection Location: Bilateral 3rd interspace Skin Prep: Alcohol. Injectate: 0.5 cc 0.5% marcaine plain, 0.5 cc dexamethasone phosphate. Disposition: Patient tolerated procedure well. Injection site dressed with a band-aid.  Elongated nails -Treatment per patient request.   Noncovered  Return in about 3 weeks (around 01/02/2020) for Neuroma, Bilateral.

## 2020-01-02 ENCOUNTER — Ambulatory Visit: Payer: Medicare Other | Admitting: Podiatry

## 2020-01-16 ENCOUNTER — Other Ambulatory Visit: Payer: Self-pay

## 2020-01-16 ENCOUNTER — Ambulatory Visit: Payer: Medicare Other | Admitting: Podiatry

## 2020-01-16 DIAGNOSIS — G5763 Lesion of plantar nerve, bilateral lower limbs: Secondary | ICD-10-CM

## 2020-01-16 NOTE — Progress Notes (Signed)
  Subjective:  Patient ID: Julia Huff, female    DOB: Apr 27, 1956,  MRN: [id number]  Chief Complaint  Patient presents with  . Neuroma    Pt states she no longer has pain but "feels like I'm walking on something"    64 y.o. female presents with the above complaint. History confirmed with patient.  Objective:  Physical Exam: warm, good capillary refill, no trophic changes or ulcerative lesions, normal DP and PT pulses, and normal sensory exam. Left Foot: tenderness between the 3rd and 4th metatarsal head  Right Foot: tenderness between the 3rd and 4th metatarsal head   No images are attached to the encounter.  Radiographs: X-ray of both feet: not indicated   Assessment:  1. Morton's neuroma of both feet     Plan:  Patient was evaluated and treated and all questions answered.  Morton Neuroma -Educated on etiology -Educated on padding and proper shoegear -XR reviewed with patient -Injection delivered to the affected interspaces  Procedure: Neuroma Injection Location: Bilateral 3rd interspace Skin Prep: Alcohol. Injectate: 0.5 cc 0.5% marcaine plain, 0.5 cc celestone Disposition: Patient tolerated procedure well. Injection site dressed with a band-aid.  Return in about 3 weeks (around 02/06/2020) for Neuroma, Bilateral.

## 2020-02-06 ENCOUNTER — Other Ambulatory Visit: Payer: Self-pay

## 2020-02-06 ENCOUNTER — Ambulatory Visit: Payer: Medicare Other | Admitting: Podiatry

## 2020-02-06 DIAGNOSIS — G5763 Lesion of plantar nerve, bilateral lower limbs: Secondary | ICD-10-CM

## 2020-02-27 ENCOUNTER — Ambulatory Visit: Payer: Medicare Other | Admitting: Podiatry

## 2020-03-18 ENCOUNTER — Other Ambulatory Visit: Payer: Self-pay

## 2020-03-18 ENCOUNTER — Ambulatory Visit: Payer: Medicare Other | Admitting: Podiatry

## 2020-03-18 VITALS — Temp 97.2°F

## 2020-03-18 DIAGNOSIS — B351 Tinea unguium: Secondary | ICD-10-CM | POA: Diagnosis not present

## 2020-03-18 DIAGNOSIS — M79609 Pain in unspecified limb: Secondary | ICD-10-CM

## 2020-03-18 DIAGNOSIS — M79676 Pain in unspecified toe(s): Secondary | ICD-10-CM | POA: Diagnosis not present

## 2020-03-18 DIAGNOSIS — I739 Peripheral vascular disease, unspecified: Secondary | ICD-10-CM | POA: Diagnosis not present

## 2020-03-18 NOTE — Progress Notes (Signed)
  Subjective:  Patient ID: Julia Huff, female    DOB: [birth year],  MRN: [id number]  Chief Complaint  Patient presents with  . Neuroma    Bilateral neuroma follow up. Pt states "They feel worse after the injection".  . Foot Problem    "I think I have neuropathy"  . Nail Problem    Nail trim 1-5 bilateral  . Foot Problem    Pt states lack of mobility in toes    64 y.o. female presents with the above complaint. History confirmed with patient.   Objective:  Physical Exam: DP reduced left, no trophic changes or ulcerative lesions, normal DP and PT pulses, normal sensory exam and PT reduced bilateral. Only slight pain left 3rd interspace. Onychomycosis with nail thickening and trophic changes. Assessment:   1. Pain due to onychomycosis of nail   2. PAD (peripheral artery disease) (HCC)    Plan:  Patient was evaluated and treated and all questions answered.  PAD -Will order vascular studies to rule out PAD  Onychomycosis and PAD -Nails palliatively debrided secondary to pain  Procedure: Nail Debridement Rationale: Patient meets criteria for routine foot care due to PAD Type of Debridement: manual, sharp debridement. Instrumentation: Nail nipper, rotary burr. Number of Nails: 10   No follow-ups on file.

## 2020-03-19 ENCOUNTER — Telehealth: Payer: Self-pay | Admitting: *Deleted

## 2020-03-19 DIAGNOSIS — I739 Peripheral vascular disease, unspecified: Secondary | ICD-10-CM

## 2020-03-19 NOTE — Telephone Encounter (Signed)
Orders faxed to [facility]. 

## 2020-03-19 NOTE — Telephone Encounter (Signed)
-----   Message from Park Liter, DPM sent at 03/18/2020  5:46 PM EDT ----- Can we order vascular studies?

## 2020-03-28 NOTE — Progress Notes (Signed)
  Subjective:  Patient ID: Julia Huff, female    DOB: 1956/11/12,  MRN: [id number]  Chief Complaint  Patient presents with  . Neuroma    Pt states some improvement with injections.  . Foot Problem    Numbness/tingling/cramping which has been occuring over the past several visits, pt states "I think I have neuropathy".    64 y.o. female presents with the above complaint. History confirmed with patient.  Objective:  Physical Exam: warm, good capillary refill, no trophic changes or ulcerative lesions, normal DP and PT pulses, and normal sensory exam. Left Foot: tenderness between the 3rd and 4th metatarsal head  Right Foot: tenderness between the 3rd and 4th metatarsal head   No images are attached to the encounter.  Assessment:   1. Morton's neuroma of both feet     Plan:  Patient was evaluated and treated and all questions answered.  Morton Neuroma -Repeat injections as below   Procedure: Neuroma Injection Location: Bilateral 3rd interspace Skin Prep: Alcohol. Injectate: 0.5 cc 0.5% marcaine plain, 0.5 cc dexamethasone phosphate. Disposition: Patient tolerated procedure well. Injection site dressed with a band-aid.   No follow-ups on file.

## 2020-03-30 ENCOUNTER — Encounter (HOSPITAL_COMMUNITY): Payer: Medicare Other

## 2020-03-31 ENCOUNTER — Other Ambulatory Visit: Payer: Self-pay

## 2020-03-31 ENCOUNTER — Ambulatory Visit (HOSPITAL_COMMUNITY)
Admission: RE | Admit: 2020-03-31 | Discharge: 2020-03-31 | Disposition: A | Discharge status: Home / Self Care | Payer: Medicare Other | Source: Ambulatory Visit | Attending: Cardiology | Admitting: Cardiology

## 2020-03-31 DIAGNOSIS — I739 Peripheral vascular disease, unspecified: Secondary | ICD-10-CM | POA: Diagnosis not present

## 2020-04-02 ENCOUNTER — Encounter (HOSPITAL_COMMUNITY): Payer: Medicare Other

## 2020-04-07 ENCOUNTER — Telehealth: Payer: Self-pay | Admitting: *Deleted

## 2020-04-07 NOTE — Telephone Encounter (Signed)
Pt states she had test run at [facility] and they said the results would be in her chart, but no one has called.

## 2020-04-13 NOTE — Telephone Encounter (Signed)
I informed pt of Dr. Kandice Hams review of results. Pt states her feet still hurt.

## 2020-04-13 NOTE — Telephone Encounter (Signed)
Her vascular studies are essentially normal

## 2020-06-18 ENCOUNTER — Other Ambulatory Visit: Payer: Self-pay | Admitting: Family Medicine

## 2020-06-18 DIAGNOSIS — M8580 Other specified disorders of bone density and structure, unspecified site: Secondary | ICD-10-CM

## 2020-07-30 ENCOUNTER — Encounter: Payer: Self-pay | Admitting: Diagnostic Neuroimaging

## 2020-07-30 ENCOUNTER — Ambulatory Visit (INDEPENDENT_AMBULATORY_CARE_PROVIDER_SITE_OTHER): Payer: Medicare (Managed Care) | Admitting: Diagnostic Neuroimaging

## 2020-07-30 VITALS — BP 154/108 | HR 102 | Ht 62.5 in | Wt 215.0 lb

## 2020-07-30 DIAGNOSIS — M79671 Pain in right foot: Secondary | ICD-10-CM | POA: Diagnosis not present

## 2020-07-30 DIAGNOSIS — M79672 Pain in left foot: Secondary | ICD-10-CM | POA: Diagnosis not present

## 2020-07-30 DIAGNOSIS — R200 Anesthesia of skin: Secondary | ICD-10-CM

## 2020-07-30 NOTE — Progress Notes (Signed)
[facility]  PATIENT: Julia Huff DOB: [birth year]  REFERRING CLINICIAN: Catha Gosselin, MD HISTORY FROM: patient  REASON FOR VISIT: new consult    HISTORICAL  CHIEF COMPLAINT:  Chief Complaint  Patient presents with  . New Patient (Initial Visit)    rm 6, alone, pain in both feet and numbness     HISTORY OF PRESENT ILLNESS:   64 year old female here for evaluation of numbness and pain in feet.  History of bipolar disorder and hypertension.  Symptoms started about 1 year ago.  She reports numbness and pain in the bottom of her feet and into her toes.  She saw several podiatrist and foot specialist who tried injections without relief.  Of note patient weighed 180 pounds in 2018-04-15.  Her mother passed away in 04/16/2019 and patient lost weight down to 107 pounds.  Since then she has gained weight back up to 215 pounds.  This is in the same timeframe that she has developed this foot pain problem.  No back pain.  No proximal leg pain.  No numbness or tingling in her hands, arms or neck pain.    REVIEW OF SYSTEMS: Full 14 system review of systems performed and negative with exception of: As per HPI.  ALLERGIES: Allergies  Allergen Reactions  . Ambien [Zolpidem Tartrate] Other (See Comments)    Per sister - makes pt sleep walk, does not help pt sleep  . Asendin [Amoxapine]     unknown  . Fluorescein     Unknown   . Lithobid [Lithium]     Unknown  . Thorazine [Chlorpromazine]     Unknown     HOME MEDICATIONS: Outpatient Medications Prior to Visit  Medication Sig Dispense Refill  . acetaminophen (TYLENOL) 500 MG tablet Take 500 mg by mouth every 6 (six) hours as needed for mild pain.    . ARIPiprazole (ABILIFY) 5 MG tablet Take 5 mg by mouth every morning.    . Cholecalciferol (VITAMIN D) 2000 units tablet Take 4,000 Units by mouth daily.     . divalproex (DEPAKOTE SPRINKLE) 125 MG capsule Take 2 capsules (250 mg total) by mouth 2 (two) times daily. 30  capsule 0  . levothyroxine (SYNTHROID, LEVOTHROID) 75 MCG tablet Take 75 mcg by mouth daily.  0  . Multiple Vitamins-Minerals (MULTIVITAMIN GUMMIES ADULT) CHEW Chew 2 tablets by mouth daily.    Marland Kitchen OLANZapine (ZYPREXA) 10 MG tablet Take 1 tablet (10 mg total) by mouth at bedtime. 30 tablet 0  . OLANZapine (ZYPREXA) 5 MG tablet Take 5-10 mg by mouth See admin instructions. Take one tablet in the morning, take two tablets in the evening.    . potassium chloride (K-DUR) 10 MEQ tablet Take 10 mEq by mouth daily.   1  . potassium chloride (KLOR-CON) 10 MEQ tablet Take 10 mEq by mouth daily.    Marland Kitchen triamterene-hydrochlorothiazide (MAXZIDE-25) 37.5-25 MG tablet Take 1 tablet by mouth daily.     . nitrofurantoin, macrocrystal-monohydrate, (MACROBID) 100 MG capsule Take 100 mg by mouth 2 (two) times daily.    . polyethylene glycol-electrolytes (NULYTELY/GOLYTELY) 420 g solution MIX AND DRINK UTD     No facility-administered medications prior to visit.    PAST MEDICAL HISTORY: Past Medical History:  Diagnosis Date  . Bipolar disorder (HCC)   . Cellulitis   . HTN (hypertension)   . Hypothyroidism   . Osteopenia   . Peripheral edema   . 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 APPENDECTOMY  2000  . BRAIN SURGERY  2008   due to  Rt PICA aneurysm rupture  . FINGER SURGERY      FAMILY HISTORY: Family History  Problem Relation Age of Onset  . Lung cancer Father   . Hypertension Mother   . Dementia Mother   . Hypertension Sister   . Healthy Brother   . Healthy Sister     SOCIAL HISTORY: Social History   Socioeconomic History  . Marital status: Single    Spouse name: Not on file  . Number of children: Not on file  . Years of education: Not on file  . Highest education level: Not on file  Occupational  History  . Not on file  Tobacco Use  . Smoking status: Former Games developer  . Smokeless tobacco: Never Used  . Tobacco comment: quit 2008  Substance and Sexual Activity  . Alcohol use: Yes    Alcohol/week: 0.0 standard drinks    Comment: occasional wine with diner  . Drug use: No  . Sexual activity: Not on file  Other Topics Concern  . Not on file  Social History Narrative  . Not on file   Social Determinants of Health   Financial Resource Strain:   . Difficulty of Paying Living Expenses:   Food Insecurity:   . Worried About Programme researcher, broadcasting/film/video in the Last Year:   . Barista in the Last Year:   Transportation Needs:   . Freight forwarder (Medical):   Marland Kitchen Lack of Transportation (Non-Medical):   Physical Activity:   . Days of Exercise per Week:   . Minutes of Exercise per Session:   Stress:   . Feeling of Stress :   Social Connections:   . Frequency of Communication with Friends and Family:   . Frequency of Social Gatherings with Friends and Family:   . Attends Religious Services:   . Active Member of Clubs or Organizations:   . Attends Banker Meetings:   Marland Kitchen Marital Status:   Intimate Partner Violence:   . Fear of Current or Ex-Partner:   . Emotionally Abused:   Marland Kitchen Physically Abused:   . Sexually Abused:      PHYSICAL EXAM  GENERAL EXAM/CONSTITUTIONAL: Vitals:  Vitals:   07/30/20 1126  BP: (!) 154/108  Pulse: (!) 102  Weight: 215 lb (97.5 kg)  Height: 5' 2.5" (1.588 m)     Body mass index is 38.7 kg/m. Wt Readings from Last 3 Encounters:  07/30/20 215 lb (97.5 kg)  06/22/19 107 lb (48.5 kg)     Patient is in no distress; well developed, nourished and groomed; neck is supple  CARDIOVASCULAR:  Examination of carotid arteries is normal; no carotid bruits  Regular rate and rhythm, no murmurs  Examination of peripheral vascular system by observation and palpation is normal  SWELLING IN FEET / ANKLES  EYES:  Ophthalmoscopic  exam of optic discs and posterior segments is normal; no papilledema or hemorrhages  No exam data present  MUSCULOSKELETAL:  Gait, strength, tone, movements noted in Neurologic exam below  NEUROLOGIC: MENTAL STATUS:  No flowsheet data found.  awake, alert, oriented to person, place and time  recent and remote memory intact  normal attention and concentration  language fluent, comprehension intact, naming intact  fund of knowledge appropriate  CRANIAL  NERVE:   2nd - no papilledema on fundoscopic exam  2nd, 3rd, 4th, 6th - pupils equal and reactive to light, visual fields full to confrontation, extraocular muscles intact, no nystagmus  5th - facial sensation symmetric  7th - facial strength symmetric  8th - hearing intact  9th - palate elevates symmetrically, uvula midline  11th - shoulder shrug symmetric  12th - tongue protrusion midline  MOTOR:   normal bulk and tone, full strength in the BUE, BLE  SENSORY:   normal and symmetric to light touch, temperature, vibration  COORDINATION:   finger-nose-finger, fine finger movements normal  REFLEXES:   deep tendon reflexes trace and symmetric  GAIT/STATION:   narrow based gait; ANTALGIC GAIT; UNSTEADY     DIAGNOSTIC DATA (LABS, IMAGING, TESTING) - I reviewed patient records, labs, notes, testing and imaging myself where available.  Lab Results  Component Value Date   WBC 7.3 10/10/2019   HGB 11.3 (L) 10/10/2019   HCT 35.0 (L) 10/10/2019   MCV 99.2 10/10/2019   PLT 270 10/10/2019      Component Value Date/Time   NA 136 10/10/2019 2216   K 3.5 10/10/2019 2216   CL 102 10/10/2019 2216   CO2 27 10/10/2019 2216   GLUCOSE 104 (H) 10/10/2019 2216   BUN 16 10/10/2019 2216   CREATININE 0.83 10/10/2019 2216   CALCIUM 8.9 10/10/2019 2216   PROT 6.2 (L) 10/10/2019 2216   ALBUMIN 3.4 (L) 10/10/2019 2216   AST 19 10/10/2019 2216   ALT 8 10/10/2019 2216   ALKPHOS 41 10/10/2019 2216   BILITOT 0.4  10/10/2019 2216   GFRNONAA >60 10/10/2019 2216   GFRAA >60 10/10/2019 2216   Lab Results  Component Value Date   CHOL  12/14/2007    165        ATP III CLASSIFICATION:  <200     mg/dL   Desirable  128-786  mg/dL   Borderline High  >=767    mg/dL   High   TRIG 76 20/94/7096   No results found for: HGBA1C No results found for: VITAMINB12 No results found for: TSH     ASSESSMENT AND PLAN  64 y.o. year old female here with:  Dx:  1. Numbness in feet   2. Pain in both feet     PLAN:  NUMBNESS / PAIN IN FEET (since 2020; possible neuropathy; also major weight changes; 180 --> 107 --> 215LBS) - check neuropathy labs - check EMG/NCS  Orders Placed This Encounter  Procedures  . CBC with Differential/Platelet  . Comprehensive metabolic panel  . Hemoglobin A1c  . Vitamin B12  . TSH  . NCV with EMG(electromyography)   Return for for NCV/EMG.    Suanne Marker, MD 07/30/2020, 11:42 AM Certified in Neurology, Neurophysiology and Neuroimaging  [facility] [address], [address] [place], [place] [address] [phone]

## 2020-07-31 LAB — COMPREHENSIVE METABOLIC PANEL
ALT: 8 IU/L (ref 0–32)
AST: 19 IU/L (ref 0–40)
Albumin/Globulin Ratio: 1.3 (ref 1.2–2.2)
Albumin: 4.3 g/dL (ref 3.8–4.8)
Alkaline Phosphatase: 99 IU/L (ref 48–121)
BUN/Creatinine Ratio: 15 (ref 12–28)
BUN: 14 mg/dL (ref 8–27)
Bilirubin Total: 0.3 mg/dL (ref 0.0–1.2)
CO2: 24 mmol/L (ref 20–29)
Calcium: 9.3 mg/dL (ref 8.7–10.3)
Chloride: 97 mmol/L (ref 96–106)
Creatinine, Ser: 0.96 mg/dL (ref 0.57–1.00)
GFR calc Af Amer: 71 mL/min/{1.73_m2} (ref >59)
GFR calc non Af Amer: 62 mL/min/{1.73_m2} (ref >59)
Globulin, Total: 3.2 g/dL (ref 1.5–4.5)
Glucose: 88 mg/dL (ref 65–99)
Potassium: 3.9 mmol/L (ref 3.5–5.2)
Sodium: 138 mmol/L (ref 134–144)
Total Protein: 7.5 g/dL (ref 6.0–8.5)

## 2020-07-31 LAB — CBC WITH DIFFERENTIAL/PLATELET
Basophils Absolute: 0.1 10*3/uL (ref 0.0–0.2)
Basos: 1 %
EOS (ABSOLUTE): 0.2 10*3/uL (ref 0.0–0.4)
Eos: 2 %
Hematocrit: 38.7 % (ref 34.0–46.6)
Hemoglobin: 13.4 g/dL (ref 11.1–15.9)
Immature Grans (Abs): 0 10*3/uL (ref 0.0–0.1)
Immature Granulocytes: 1 %
Lymphocytes Absolute: 2.1 10*3/uL (ref 0.7–3.1)
Lymphs: 26 %
MCH: 31.6 pg (ref 26.6–33.0)
MCHC: 34.6 g/dL (ref 31.5–35.7)
MCV: 91 fL (ref 79–97)
Monocytes Absolute: 0.6 10*3/uL (ref 0.1–0.9)
Monocytes: 7 %
Neutrophils Absolute: 5.3 10*3/uL (ref 1.4–7.0)
Neutrophils: 63 %
Platelets: 256 10*3/uL (ref 150–450)
RBC: 4.24 x10E6/uL (ref 3.77–5.28)
RDW: 13.8 % (ref 11.7–15.4)
WBC: 8.3 10*3/uL (ref 3.4–10.8)

## 2020-07-31 LAB — HEMOGLOBIN A1C
Est. average glucose Bld gHb Est-mCnc: 148 mg/dL
Hgb A1c MFr Bld: 6.8 % — ABNORMAL HIGH (ref 4.8–5.6)

## 2020-07-31 LAB — TSH: TSH: 1.89 u[IU]/mL (ref 0.450–4.500)

## 2020-07-31 LAB — VITAMIN B12: Vitamin B-12: 860 pg/mL (ref 232–1245)

## 2020-08-02 ENCOUNTER — Telehealth: Payer: Self-pay | Admitting: *Deleted

## 2020-08-02 NOTE — Telephone Encounter (Signed)
Spoke with patient and informed her that lab work is mostly normal except screening test for diabetes is elevated. Advised she see primary care physician for tighter diabetes control. She stated she has already seen PCP, requested lab results be sent to him. She asked if she still needs EMG/NCS; I advised her it is part of Dr [employer] work up on her . Patient verbalized understanding, appreciation.

## 2020-08-02 NOTE — Progress Notes (Signed)
Kindly inform the patient that lab work is mostly normal except screening test for diabetes is elevated and to see primary care physician for tighter diabetes control

## 2020-08-26 ENCOUNTER — Ambulatory Visit: Payer: Medicare Other

## 2020-09-02 ENCOUNTER — Ambulatory Visit: Payer: Medicare Other

## 2020-09-09 ENCOUNTER — Ambulatory Visit: Payer: Medicare Other

## 2020-09-09 ENCOUNTER — Ambulatory Visit (INDEPENDENT_AMBULATORY_CARE_PROVIDER_SITE_OTHER): Payer: Medicare (Managed Care) | Admitting: Diagnostic Neuroimaging

## 2020-09-09 ENCOUNTER — Ambulatory Visit: Payer: Medicare (Managed Care) | Admitting: Diagnostic Neuroimaging

## 2020-09-09 DIAGNOSIS — R200 Anesthesia of skin: Secondary | ICD-10-CM | POA: Diagnosis not present

## 2020-09-09 DIAGNOSIS — M79671 Pain in right foot: Secondary | ICD-10-CM

## 2020-09-09 DIAGNOSIS — M79672 Pain in left foot: Secondary | ICD-10-CM

## 2020-09-09 DIAGNOSIS — Z0289 Encounter for other administrative examinations: Secondary | ICD-10-CM

## 2020-09-09 NOTE — Procedures (Signed)
[facility]  NCS (NERVE CONDUCTION STUDY) WITH EMG (ELECTROMYOGRAPHY) REPORT   STUDY DATE: 09/09/20 PATIENT NAME: Julia Huff DOB: [birth year] MRN: [id number]  ORDERING CLINICIAN: Joycelyn Schmid, MD   TECHNOLOGIST: Durenda Age ELECTROMYOGRAPHER: Glenford Bayley. Aeon Kessner, MD  CLINICAL INFORMATION: 64 year old female with bilateral foot numbness.  FINDINGS: NERVE CONDUCTION STUDY:  Bilateral peroneal and tibial motor responses are normal.  Bilateral sural sensory responses are normal.  Bilateral superficial peroneal sensory sponsors could not be obtained.  Bilateral tibial F wave latencies are borderline prolonged.   NEEDLE ELECTROMYOGRAPHY:  Needle examination of right lower extremity is normal.   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    INTERPRETING PHYSICIAN:  Suanne Marker, MD Certified in Neurology, Neurophysiology and Neuroimaging  [facility] [address], [address] [place], [place] [address] [phone]  [facility]    Nerve / Sites Muscle Latency Ref. Amplitude Ref. Rel Amp Segments Distance Velocity Ref. Area    ms ms mV mV %  cm m/s m/s mVms  L Peroneal - EDB     Ankle EDB 4.2 ?6.5 6.4 ?2.0 100 Ankle - EDB 9   15.5     Fib head EDB 10.3  5.1  79.2 Fib head - Ankle 27 44 ?44 14.4     Pop fossa EDB 12.6  4.7  92 Pop fossa - Fib head 10 44 ?44 14.0         Pop fossa - Ankle      R Peroneal - EDB     Ankle EDB 4.8 ?6.5 4.3 ?2.0 100 Ankle - EDB 9   10.8     Fib head EDB 10.1  3.8  87.8 Fib head - Ankle 25 47 ?44 10.3     Pop fossa EDB 12.2  3.7  99.4 Pop fossa - Fib head 10 48 ?44 10.4         Pop fossa - Ankle      L Tibial - AH     Ankle AH 4.8 ?5.8 7.1 ?4.0 100 Ankle - AH 9   16.0     Pop fossa AH 13.6  2.9  41.1 Pop fossa - Ankle 36 41 ?41 14.6  R Tibial - AH     Ankle AH 4.5 ?5.8 6.8 ?4.0 100 Ankle - AH 9   22.5     Pop fossa AH 13.1  4.7  69.1 Pop fossa - Ankle 36 42  ?41 24.3             SNC    Nerve / Sites Rec. Site Peak Lat Ref.  Amp Ref. Segments Distance    ms ms V V  cm  L Sural - Ankle (Calf)     Calf Ankle 4.1 ?4.4 7 ?6 Calf - Ankle 14  R Sural - Ankle (Calf)     Calf Ankle 3.3 ?4.4 6 ?6 Calf - Ankle 14  L Superficial peroneal - Ankle     Lat leg Ankle NR ?4.4 NR ?6 Lat leg - Ankle 14  R Superficial peroneal - Ankle     Lat leg Ankle NR ?4.4 NR ?6 Lat leg - Ankle 14             F  Wave    Nerve F Lat Ref.   ms ms  L Tibial - AH 56.8 ?56.0  R Tibial - AH 58.3 ?56.0  EMG Summary Table    Spontaneous MUAP Recruitment  Muscle IA Fib PSW Fasc Other Amp Dur. Poly Pattern  R. Vastus medialis Normal None None None _______ Normal Normal Normal Normal  R. Tibialis anterior Normal None None None _______ Normal Normal Normal Normal  R. Gastrocnemius (Medial head) Normal None None None _______ Normal Normal Normal Normal

## 2020-09-13 ENCOUNTER — Ambulatory Visit
Admission: RE | Admit: 2020-09-13 | Discharge: 2020-09-13 | Disposition: A | Discharge status: Home / Self Care | Payer: Medicare (Managed Care) | Source: Ambulatory Visit | Attending: Family Medicine | Admitting: Family Medicine

## 2020-09-13 ENCOUNTER — Other Ambulatory Visit: Payer: Self-pay

## 2020-09-13 DIAGNOSIS — M8580 Other specified disorders of bone density and structure, unspecified site: Secondary | ICD-10-CM

## 2021-05-12 ENCOUNTER — Encounter: Payer: Self-pay | Admitting: Registered"

## 2021-05-12 ENCOUNTER — Encounter: Payer: Medicare Other | Attending: Family Medicine | Admitting: Registered"

## 2021-05-12 ENCOUNTER — Other Ambulatory Visit: Payer: Self-pay

## 2021-05-12 DIAGNOSIS — E119 Type 2 diabetes mellitus without complications: Secondary | ICD-10-CM | POA: Diagnosis not present

## 2021-05-12 NOTE — Patient Instructions (Signed)
Goals:  Follow Diabetes Meal Plan as instructed  Eat 3 meals and 2 snacks, every 3-5 hrs  Aim to have 1/2 plate as non-starch vegetables + 1/4 plate of lean protein + 1/4 plate of carbohydrates for lunch and dinner.   Balance breakfast with carbohydrates + protein such as:  Oatmeal + egg  Egg + toast  Cereal + 2% milk  Snacks to include carbohydrates + lean protein such as:  Fruit and nuts  Trail mix  Peanut butter crackers  Cheese and crackers  Increase water intake to at least 64 oz a day. May use flavor packs.   Add lean protein foods to meals/snacks  Bring food record and glucose log to your next nutrition visit

## 2021-05-12 NOTE — Progress Notes (Signed)
Diabetes Self-Management Education  Visit Type:  First/Initial  Appt. Start Time: 10:30 Appt. End Time: 11:49  05/12/2021  [name], identified by name and date of birth, is a 65 y.o. female with a diagnosis of Diabetes: Type 2.   ASSESSMENT  Recent referral lab results: Chol (157), HDL (72), Trg (87). Pt arrives stating she has gained weight recently likely due to medications. States she lost weight 2 years ago when mom passed away. 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 Reports she was weighing about 184 lbs before mom passed away and then lost down to 106 lbs. States she wants to weigh about 140 lbs and currently weighs about 220 lbs. States sister commented on how pt looked malnourished during that time.   Pt states she had a brain aneurysm and as attends physical therapy 2x/week, 60 min to help with balance. Reports history of bipolar. Reports she has new PCP, Jarrett Soho, at [facility].  States she tracks food intake and weighs at home once/week. States she has been avoiding pasta, bread, potatoes, etc. States she lives with younger sister. States they don't cook as often since mom passed away. States they typically will eat at [employer], or [employer]. States she tries not to have a lot of fast food and choosing broiled/grilled fish more often.   Pt expectations: wants to know what she should/shouldn't be eating; ways to decrease blood sugar numbers  There were no vitals taken for this visit. There is no height or weight on file to calculate BMI.    Diabetes Self-Management Education - 05/12/21 1043      Health Coping   How would you rate your overall health? Good      Psychosocial Assessment   Patient Belief/Attitude about Diabetes Motivated to manage diabetes    Self-care barriers None    Self-management support Family;Friends    Patient Concerns Nutrition/Meal planning    Special Needs None    Preferred Learning Style No preference indicated    Learning  Readiness Ready      Complications   Last HgB A1C per patient/outside source 7 %    How often do you check your blood sugar? 0 times/day (not testing)    Have you had a dilated eye exam in the past 12 months? Yes    Have you had a dental exam in the past 12 months? Yes    Are you checking your feet? No      Dietary Intake   Breakfast scrambled egg + 8 oz OJ or watermelon    Snack (morning) mini popsicle    Lunch [employer] - whopper jr + 4 mozerrella sticks + Icee    Snack (afternoon) skinny pop popcorn    Dinner 5-6 pm - [employer] - cheeseburger + fries + sweet tea    Beverage(s) sweet tea, icee, orange juice, water (4*8 oz; 32 oz)      Exercise   Exercise Type ADL's   physical therapy + exercises   How many days per week to you exercise? 2    How many minutes per day do you exercise? 60    Total minutes per week of exercise 120      Patient Education   Previous Diabetes Education No    Disease state  Definition of diabetes, type 1 and 2, and the diagnosis of diabetes;Factors that contribute to the development of diabetes    Nutrition management  Role of diet in the treatment of diabetes and the relationship  between the three main macronutrients and blood glucose level;Food label reading, portion sizes and measuring food.;Reviewed blood glucose goals for pre and post meals and how to evaluate the patients' food intake on their blood glucose level.;Effects of alcohol on blood glucose and safety factors with consumption of alcohol.;Information on hints to eating out and maintain blood glucose control.    Monitoring Purpose and frequency of SMBG.;Interpreting lab values - A1C, lipid, urine microalbumina.;Identified appropriate SMBG and/or A1C goals.    Acute complications Taught treatment of hypoglycemia - the 15 rule.;Discussed and identified patients' treatment of hyperglycemia.    Chronic complications Relationship between chronic complications and blood glucose control;Reviewed with  patient heart disease, higher risk of, and prevention    Psychosocial adjustment Worked with patient to identify barriers to care and solutions;Role of stress on diabetes;Identified and addressed patients feelings and concerns about diabetes      Individualized Goals (developed by patient)   Nutrition Follow meal plan discussed    Physical Activity Exercise 1-2 times per week;60 minutes per day    Medications take my medication as prescribed    Reducing Risk increase portions of nuts and seeds;treat hypoglycemia with 15 grams of carbs if blood glucose less than 70mg /dL      Post-Education Assessment   Patient understands the diabetes disease and treatment process. Demonstrates understanding / competency    Patient understands incorporating nutritional management into lifestyle. Demonstrates understanding / competency    Patient undertands incorporating physical activity into lifestyle. Needs Review    Patient understands using medications safely. Needs Review    Patient understands monitoring blood glucose, interpreting and using results Demonstrates understanding / competency    Patient understands prevention, detection, and treatment of acute complications. Demonstrates understanding / competency    Patient understands prevention, detection, and treatment of chronic complications. Needs Review    Patient understands how to develop strategies to address psychosocial issues. Demonstrates understanding / competency    Patient understands how to develop strategies to promote health/change behavior. Demonstrates understanding / competency      Outcomes   Program Status Not Completed           Learning Objective:  Patient will have a greater understanding of diabetes self-management. Patient education plan is to attend individual and/or group sessions per assessed needs and concerns.   Plan:   Patient Instructions  Goals:  Follow Diabetes Meal Plan as instructed  Eat 3 meals and 2  snacks, every 3-5 hrs  Aim to have 1/2 plate as non-starch vegetables + 1/4 plate of lean protein + 1/4 plate of carbohydrates for lunch and dinner.   Balance breakfast with carbohydrates + protein such as:  Oatmeal + egg  Egg + toast  Cereal + 2% milk  Snacks to include carbohydrates + lean protein such as:  Fruit and nuts  Trail mix  Peanut butter crackers  Cheese and crackers  Increase water intake to at least 64 oz a day. May use flavor packs.   Add lean protein foods to meals/snacks  Bring food record and glucose log to your next nutrition visit     Expected Outcomes:  Demonstrated interest in learning. Expect positive outcomes  Education material provided: ADA - How to Thrive: A Guide for Your Journey with Diabetes  If problems or questions, patient to contact team via:  Phone and Email  Future DSME appointment: - 4-6 wks

## 2021-06-23 ENCOUNTER — Ambulatory Visit: Payer: Medicare Other | Admitting: Registered"

## 2021-07-13 ENCOUNTER — Ambulatory Visit: Payer: Medicare Other | Admitting: Registered"

## 2021-07-19 ENCOUNTER — Encounter: Payer: Self-pay | Admitting: Registered"

## 2021-07-19 ENCOUNTER — Other Ambulatory Visit: Payer: Self-pay

## 2021-07-19 ENCOUNTER — Encounter: Payer: Medicare Other | Attending: Family Medicine | Admitting: Registered"

## 2021-07-19 DIAGNOSIS — E119 Type 2 diabetes mellitus without complications: Secondary | ICD-10-CM | POA: Insufficient documentation

## 2021-07-19 NOTE — Patient Instructions (Signed)
Goals: Follow Diabetes Meal Plan as instructed Eat 3 meals and 2 snacks, every 3-5 hrs Add lean protein foods to meals/snacks Aim for 30 mins of physical activity daily

## 2021-07-19 NOTE — Progress Notes (Signed)
Diabetes Self-Management Education  Visit Type:  Follow-up  Appt. Start Time: 9:20 Appt. End Time: 9:56  07/21/2021  [name], identified by name and date of birth, is a 65 y.o. female with a diagnosis of Diabetes: Type 2.   ASSESSMENT  Pt arrives stating she has been drinking more water and has less desire for soda intake. Reports checking feet daily as well since previous visit. States she has not had a chance to incorporate dietary changes yet but will keep trying.   There were no vitals taken for this visit. There is no height or weight on file to calculate BMI.    Diabetes Self-Management Education - 07/19/21 0925       Complications   Are you checking your feet? Yes    How many days per week are you checking your feet? 7      Dietary Intake   Breakfast watermelon + nectarine    Lunch BLT    Dinner [employer] - 1.5 fried chicken strips + 1/2 cole slaw + toast    Beverage(s) water (96 oz)      Exercise   Exercise Type ADL's;Light (walking / raking leaves)   exercises from physical therapy   How many days per week to you exercise? 5    How many minutes per day do you exercise? 15    Total minutes per week of exercise 75      Patient Education   Physical activity and exercise  Role of exercise on diabetes management, blood pressure control and cardiac health.    Monitoring Daily foot exams;Yearly dilated eye exam;Identified appropriate SMBG and/or A1C goals.    Chronic complications Applicable immunizations;Reviewed with patient heart disease, higher risk of, and prevention;Nephropathy, what it is, prevention of, the use of ACE, ARB's and early detection of through urine microalbumia.;Retinopathy and reason for yearly dilated eye exams;Lipid levels, blood glucose control and heart disease;Assessed and discussed foot care and prevention of foot problems;Identified and discussed with patient  current chronic complications    Personal strategies to promote health  Lifestyle issues that need to be addressed for better diabetes care      Individualized Goals (developed by patient)   Reducing Risk do foot checks daily      Post-Education Assessment   Patient undertands incorporating physical activity into lifestyle. Demonstrates understanding / competency    Patient understands using medications safely. Demonstrates understanding / competency    Patient understands prevention, detection, and treatment of chronic complications. Demonstrates understanding / competency      Outcomes   Program Status Completed      Subsequent Visit   Since your last visit have you continued or begun to take your medications as prescribed? Yes    Since your last visit have you had your blood pressure checked? No    Since your last visit have you experienced any weight changes? Loss    Weight Loss (lbs) 2    Since your last visit, are you checking your blood glucose at least once a day? No             Learning Objective:  Patient will have a greater understanding of diabetes self-management. Patient education plan is to attend individual and/or group sessions per assessed needs and concerns.   Plan:   Patient Instructions  Goals: Follow Diabetes Meal Plan as instructed Eat 3 meals and 2 snacks, every 3-5 hrs Add lean protein foods to meals/snacks Aim for 30 mins of physical activity daily  Expected Outcomes:  Demonstrated interest in learning. Expect positive outcomes  Education material provided: none  If problems or questions, patient to contact team via:  Phone and Email  Future DSME appointment: - PRN

## 2021-09-01 ENCOUNTER — Other Ambulatory Visit: Payer: Self-pay

## 2021-09-01 ENCOUNTER — Ambulatory Visit: Payer: Medicare Other | Admitting: Cardiology

## 2021-09-01 ENCOUNTER — Encounter: Payer: Self-pay | Admitting: Cardiology

## 2021-09-01 VITALS — BP 146/95 | HR 89 | Temp 98.0°F | Resp 15 | Ht 62.0 in | Wt 220.0 lb

## 2021-09-01 DIAGNOSIS — R0600 Dyspnea, unspecified: Secondary | ICD-10-CM | POA: Insufficient documentation

## 2021-09-01 DIAGNOSIS — R600 Localized edema: Secondary | ICD-10-CM

## 2021-09-01 DIAGNOSIS — R0609 Other forms of dyspnea: Secondary | ICD-10-CM | POA: Insufficient documentation

## 2021-09-01 DIAGNOSIS — I10 Essential (primary) hypertension: Secondary | ICD-10-CM | POA: Insufficient documentation

## 2021-09-01 MED ORDER — FUROSEMIDE 40 MG PO TABS: 40.0000 mg | ORAL_TABLET | Freq: Every day | ORAL | 3 refills | Status: DC

## 2021-09-01 NOTE — Progress Notes (Signed)
Patient referred by Hulan Fess, MD for exertional dyspnea  Subjective:   Julia Huff, female    DOB: August 28, 1956, 65 y.o.   MRN: [id number]   Chief Complaint  Patient presents with  . Shortness of Breath  . Leg Swelling  . New Patient (Initial Visit)     HPI  65 y.o. 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  Patient is here with her sister today.  Patient is retired from [employer], cardiorespiratory due to orthopedic issues.  For last month or so, she has experienced bilateral leg edema and dyspnea with minimal physical activity.  She also has two-pillow orthopnea.  She denies any chest pain symptoms.     Past Medical History:  Diagnosis Date  . Bipolar disorder ([place])   . Cellulitis   . Diabetes mellitus without complication ([place])   . HTN (hypertension)   . Hypothyroidism   . Osteopenia   . Peripheral edema   . 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 APPENDECTOMY  2000  . BRAIN SURGERY  2008   due to  Rt PICA aneurysm rupture  . FINGER SURGERY       Social History   Tobacco Use  Smoking Status Former  Smokeless Tobacco Never  Tobacco Comments   quit 2008    Social History   Substance and Sexual Activity  Alcohol Use Yes  . Alcohol/week: 0.0 standard drinks   Comment: occasional wine with diner     Family History  Problem Relation Age of Onset  . Lung cancer Father   . Hypertension Mother   . Dementia Mother   . Hypertension Sister   . Healthy Brother   . Healthy Sister   . Cancer Other      Current Outpatient Medications on File Prior to Visit  Medication Sig Dispense Refill  . acetaminophen (TYLENOL) 500 MG  tablet Take 500 mg by mouth every 6 (six) hours as needed for mild pain.    . ARIPiprazole (ABILIFY) 5 MG tablet Take 5 mg by mouth every morning.    . Cholecalciferol (VITAMIN D) 2000 units tablet Take 4,000 Units by mouth daily.     . divalproex (DEPAKOTE SPRINKLE) 125 MG capsule Take 2 capsules (250 mg total) by mouth 2 (two) times daily. 30 capsule 0  . gabapentin (NEURONTIN) 100 MG capsule Take 100 mg by mouth 3 (three) times daily.    Marland Kitchen levothyroxine (SYNTHROID, LEVOTHROID) 75 MCG tablet Take 75 mcg by mouth daily.  0  . Multiple Vitamins-Minerals (MULTIVITAMIN GUMMIES ADULT) CHEW Chew 2 tablets by mouth daily.    Marland Kitchen OLANZapine (ZYPREXA) 10 MG tablet Take 1 tablet (10 mg total) by mouth at bedtime. 30 tablet 0  . OLANZapine (ZYPREXA) 5 MG tablet Take 5-10 mg by mouth See admin instructions. Take one tablet in the morning, take two tablets in the evening.    . potassium chloride (K-DUR) 10 MEQ tablet Take 10 mEq by mouth daily.   1  . potassium chloride (KLOR-CON) 10 MEQ tablet Take 10 mEq by mouth daily.    . rosuvastatin (CRESTOR) 5 MG tablet Take 5 mg by mouth daily.    Marland Kitchen triamterene-hydrochlorothiazide (MAXZIDE-25) 37.5-25 MG tablet Take 1 tablet  by mouth daily.      No current facility-administered medications on file prior to visit.    Cardiovascular and other pertinent studies:  EKG 09/01/2021: Sinus rhythm 94 bpm  Low voltage in precordial leads  Recent labs: 01/27/2021: Glucose 82, BUN/Cr 16/0.81. EGFR 85. Na/K 137/3.9. Rest of the CMP normal H/H 13/38. MCV 92. Platelets 267 HbA1C 7.0% Chol 187, TG 87, HDL 72, LDL 69 TSH 1.8 normal   Review of Systems  Cardiovascular:  Positive for dyspnea on exertion and leg swelling. Negative for chest pain, palpitations and syncope.        Vitals:   09/01/21 1438 09/01/21 1441  BP: (!) 175/95 (!) 143/84  Pulse: 93 93  Resp: 15   Temp: 98 F (36.7 C)   SpO2: 98% 96%     Body mass index is 40.24 kg/m. Filed Weights    09/01/21 1438  Weight: 220 lb (99.8 kg)     Objective:   Physical Exam Vitals and nursing note reviewed.  Constitutional:      General: She is not in acute distress. Neck:     Vascular: No JVD.  Cardiovascular:     Rate and Rhythm: Normal rate and regular rhythm.     Heart sounds: Normal heart sounds. No murmur heard. Pulmonary:     Effort: Pulmonary effort is normal.     Breath sounds: Normal breath sounds. No wheezing or rales.  Musculoskeletal:     Right lower leg: Edema (2+) present.     Left lower leg: Edema (2+) present.        Assessment & Recommendations:     65 y.o. 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  Exertional dyspnea, leg edema: Suspect heart failure, remains to be seen if EF is preserved on. Started Lasix 40 mg daily.  We will check echocardiogram.  Counseled the patient regarding low-salt diet.  Recommend daily weights. Based on echocardiogram, will make further changes to her medical treatment.  I would likely stop her intensity and change her to [facility] and spironolactone in future.  Hypertension: Uncontrolled today, but usually lower than this.  Hopefully diuresis will help.  Further recommendations after above testing.  Thank you for referring the patient to [place]. Please feel free to contact with any questions.   Nigel Mormon, MD Pager: [phone] Office: [phone]

## 2021-09-15 ENCOUNTER — Ambulatory Visit: Payer: Medicare Other

## 2021-09-15 ENCOUNTER — Other Ambulatory Visit: Payer: Self-pay

## 2021-09-15 DIAGNOSIS — R0600 Dyspnea, unspecified: Secondary | ICD-10-CM

## 2021-09-15 DIAGNOSIS — R600 Localized edema: Secondary | ICD-10-CM

## 2021-09-15 DIAGNOSIS — R0609 Other forms of dyspnea: Secondary | ICD-10-CM

## 2021-09-29 ENCOUNTER — Encounter: Payer: Self-pay | Admitting: Cardiology

## 2021-09-29 ENCOUNTER — Other Ambulatory Visit: Payer: Self-pay

## 2021-09-29 ENCOUNTER — Ambulatory Visit: Payer: Medicare Other | Admitting: Cardiology

## 2021-09-29 VITALS — BP 146/79 | HR 99 | Temp 98.0°F | Resp 16 | Ht 62.0 in | Wt 218.0 lb

## 2021-09-29 DIAGNOSIS — R0609 Other forms of dyspnea: Secondary | ICD-10-CM

## 2021-09-29 DIAGNOSIS — R600 Localized edema: Secondary | ICD-10-CM

## 2021-09-29 DIAGNOSIS — I5032 Chronic diastolic (congestive) heart failure: Secondary | ICD-10-CM | POA: Insufficient documentation

## 2021-09-29 MED ORDER — FUROSEMIDE 40 MG PO TABS: 40.0000 mg | ORAL_TABLET | Freq: Every day | ORAL | 3 refills | Status: DC

## 2021-09-29 MED ORDER — FUROSEMIDE 40 MG PO TABS: 40.0000 mg | ORAL_TABLET | Freq: Two times a day (BID) | ORAL | 3 refills | Status: DC

## 2021-09-29 NOTE — Progress Notes (Signed)
  Patient referred by Wharton, Courtney, PA-C for exertional dyspnea  Subjective:   Julia Huff, female    DOB: 03/19/1956, 65 y.o.   MRN: [id number]   Chief Complaint  Patient presents with   PAD (peripheral artery disease)   Hypertension   Exertional dyspnea   Follow-up    4 week     HPI  65 y.o. 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  Patient has had some improvement in her breathing and leg edema.  She has been urinating a lot and has lost couple pounds.  For office feeding skills, she has lost 2 pounds.  However, patient states that she has lost more as per her home scale.  Reviewed recent echocardiogram findings with the patient, details below.  Initial consultation HPI 08/2021: Patient is here with her sister today.  Patient is retired from [employer], cardiorespiratory due to orthopedic issues.  For last month or so, she has experienced bilateral leg edema and dyspnea with minimal physical activity.  She also has two-pillow orthopnea.  She denies any chest pain symptoms.     Current Outpatient Medications on File Prior to Visit  Medication Sig Dispense Refill   acetaminophen (TYLENOL) 500 MG tablet Take 500 mg by mouth every 6 (six) hours as needed for mild pain.     ARIPiprazole (ABILIFY) 5 MG tablet Take 5 mg by mouth every morning.     Cholecalciferol (VITAMIN D) 2000 units tablet Take 4,000 Units by mouth daily.      divalproex (DEPAKOTE SPRINKLE) 125 MG capsule Take 2 capsules (250 mg total) by mouth 2 (two) times daily. 30 capsule 0   furosemide (LASIX) 40 MG tablet Take 1 tablet (40 mg total) by mouth daily. 30 tablet 3   gabapentin (NEURONTIN) 100 MG capsule Take 100 mg by mouth 3 (three) times daily.     levothyroxine (SYNTHROID, LEVOTHROID) 75 MCG tablet Take 75 mcg by mouth daily.  0   Multiple  Vitamins-Minerals (MULTIVITAMIN GUMMIES ADULT) CHEW Chew 2 tablets by mouth daily.     OLANZapine (ZYPREXA) 10 MG tablet Take 1 tablet (10 mg total) by mouth at bedtime. 30 tablet 0   OLANZapine (ZYPREXA) 5 MG tablet Take 5-10 mg by mouth See admin instructions. Take one tablet in the morning, take two tablets in the evening.     potassium chloride (K-DUR) 10 MEQ tablet Take 10 mEq by mouth daily.   1   rosuvastatin (CRESTOR) 5 MG tablet Take 5 mg by mouth daily.     triamterene-hydrochlorothiazide (MAXZIDE-25) 37.5-25 MG tablet Take 1 tablet by mouth daily.      No current facility-administered medications on file prior to visit.    Cardiovascular and other pertinent studies:  Echocardiogram 09/15/2021:  Normal LV systolic function with EF 60%. Left ventricle cavity is small.  Normal left ventricular wall thickness. Normal global wall motion. Normal  diastolic filling pattern. Calculated EF 60%.  Left atrial cavity is mildly dilated at 4.3 cm.  Normal RV size and function.  Mild tricuspid regurgitation. RVSP measures 34 mmHg. Personally reviewed echocardiogram images.  Changes as below: Grade 1 diastolic dysfunction, normal left atrial pressure.  EKG 09/01/2021: Sinus rhythm 94 bpm  Low voltage in precordial leads  Recent labs: 01/27/2021: Glucose 82, BUN/Cr 16/0.81. EGFR 85. Na/K 137/3.9. Rest of the CMP normal H/H 13/38. MCV 92. Platelets 267 HbA1C 7.0%   Chol 187, TG 87, HDL 72, LDL 69 TSH 1.8 normal   Review of Systems  Cardiovascular:  Positive for dyspnea on exertion and leg swelling. Negative for chest pain, palpitations and syncope.        Vitals:   09/29/21 1112  BP: (!) 146/79  Pulse: 99  Resp: 16  Temp: 98 F (36.7 C)  SpO2: 96%     Body mass index is 39.87 kg/m. Filed Weights   09/29/21 1112  Weight: 218 lb (98.9 kg)     Objective:   Physical Exam Vitals and nursing note reviewed.  Constitutional:      General: She is not in acute distress. Neck:      Vascular: No JVD.  Cardiovascular:     Rate and Rhythm: Normal rate and regular rhythm.     Heart sounds: Normal heart sounds. No murmur heard. Pulmonary:     Effort: Pulmonary effort is normal.     Breath sounds: Normal breath sounds. No wheezing or rales.  Musculoskeletal:     Right lower leg: Edema (2+) present.     Left lower leg: Edema (2+) present.        Assessment & Recommendations:     65 y.o. 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  Exertional dyspnea, leg edema: Echocardiogram shows grade 1 diastolic function.  However, clinically, appears to be HFpEF. Increase Lasix to 40 mg twice daily. Continue triamterene-HCTZ for now. In future, could consider changing to spironolactone.  Recommend daily weights.  Hypertension: Improving, but remains elevated. Hopefully, additional lasix will help.   Further recommendations after above testing.  Thank you for referring the patient to us. Please feel free to contact with any questions.    J , MD Pager: [phone] Office: [phone]  

## 2021-10-11 LAB — BASIC METABOLIC PANEL
BUN/Creatinine Ratio: 19 (ref 12–28)
BUN: 20 mg/dL (ref 8–27)
CO2: 33 mmol/L — ABNORMAL HIGH (ref 20–29)
Calcium: 9.3 mg/dL (ref 8.7–10.3)
Chloride: 86 mmol/L — ABNORMAL LOW (ref 96–106)
Creatinine, Ser: 1.07 mg/dL — ABNORMAL HIGH (ref 0.57–1.00)
Glucose: 92 mg/dL (ref 70–99)
Potassium: 3.2 mmol/L — ABNORMAL LOW (ref 3.5–5.2)
Sodium: 134 mmol/L (ref 134–144)
eGFR: 57 mL/min/{1.73_m2} — ABNORMAL LOW (ref >59)

## 2021-10-11 LAB — BRAIN NATRIURETIC PEPTIDE: BNP: 14.6 pg/mL (ref 0.0–100.0)

## 2021-10-13 MED ORDER — POTASSIUM CHLORIDE ER 20 MEQ PO TBCR: 40.0000 meq | EXTENDED_RELEASE_TABLET | Freq: Every day | ORAL | 2 refills | Status: DC

## 2021-10-13 NOTE — Progress Notes (Signed)
Potassium is low. Please increase Kdur from daily to 40 mEq daily. In future, I may recommend spironolactone, which will avoid potassium loss. Prescription of increased dose Kdur sent.  Thanks MJP

## 2021-10-13 NOTE — Addendum Note (Signed)
Addended by: Elder Negus on: 10/13/2021 12:00 PM   Modules accepted: Orders

## 2021-10-14 NOTE — Progress Notes (Signed)
Called patient, NA, LMAM

## 2021-10-18 NOTE — Progress Notes (Signed)
Called pt, no answer. Left vm requesting call back?

## 2021-10-19 ENCOUNTER — Telehealth: Payer: Self-pay

## 2021-10-19 NOTE — Progress Notes (Signed)
I will do so at the upcoming visit after rechecking her BP.  Thanks MJP

## 2021-10-19 NOTE — Progress Notes (Signed)
Spoke with patient, she is willing to start Spironolactone, if you send it in.

## 2021-10-19 NOTE — Telephone Encounter (Signed)
Error

## 2021-10-20 NOTE — Progress Notes (Signed)
Patient is aware 

## 2021-10-27 ENCOUNTER — Ambulatory Visit: Payer: Medicare Other | Admitting: Cardiology

## 2021-10-27 ENCOUNTER — Other Ambulatory Visit: Payer: Self-pay

## 2021-10-27 ENCOUNTER — Encounter: Payer: Self-pay | Admitting: Cardiology

## 2021-10-27 VITALS — BP 148/80 | HR 94 | Temp 98.5°F | Resp 16 | Ht 62.0 in | Wt 213.0 lb

## 2021-10-27 DIAGNOSIS — I10 Essential (primary) hypertension: Secondary | ICD-10-CM

## 2021-10-27 DIAGNOSIS — E871 Hypo-osmolality and hyponatremia: Secondary | ICD-10-CM

## 2021-10-27 MED ORDER — SPIRONOLACTONE 50 MG PO TABS: 50.0000 mg | ORAL_TABLET | Freq: Every day | ORAL | 3 refills | Status: DC

## 2021-10-27 NOTE — Progress Notes (Signed)
Patient referred by Marda Stalker, PA-C for exertional dyspnea  Subjective:   Julia Huff, female    DOB: September 02, 1956, 65 y.o.   MRN: [id number]   Chief Complaint  Patient presents with   Leg Swelling   Congestive Heart Failure   Follow-up     HPI  65 y.o. 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  Patient has had some improvement in her breathing and leg edema.  Reviewed recent ;abs with the patient., details below. Patient reports constipation.   Initial consultation HPI 08/2021: Patient is here with her sister today.  Patient is retired from [employer], cardiorespiratory due to orthopedic issues.  For last month or so, she has experienced bilateral leg edema and dyspnea with minimal physical activity.  She also has two-pillow orthopnea.  She denies any chest pain symptoms.     Current Outpatient Medications on File Prior to Visit  Medication Sig Dispense Refill   acetaminophen (TYLENOL) 500 MG tablet Take 500 mg by mouth every 6 (six) hours as needed for mild pain.     ARIPiprazole (ABILIFY) 5 MG tablet Take 5 mg by mouth every morning.     Cholecalciferol (VITAMIN D) 2000 units tablet Take 4,000 Units by mouth daily.      divalproex (DEPAKOTE SPRINKLE) 125 MG capsule Take 2 capsules (250 mg total) by mouth 2 (two) times daily. 30 capsule 0   furosemide (LASIX) 40 MG tablet Take 1 tablet (40 mg total) by mouth 2 (two) times daily. 120 tablet 3   gabapentin (NEURONTIN) 100 MG capsule Take 100 mg by mouth 3 (three) times daily.     levothyroxine (SYNTHROID, LEVOTHROID) 75 MCG tablet Take 75 mcg by mouth daily.  0   metformin (FORTAMET) 500 MG (OSM) 24 hr tablet Take 500 mg by mouth daily with breakfast.     Multiple Vitamins-Minerals (MULTIVITAMIN GUMMIES ADULT) CHEW Chew 2 tablets by mouth daily.     OLANZapine  (ZYPREXA) 10 MG tablet Take 1 tablet (10 mg total) by mouth at bedtime. 30 tablet 0   OLANZapine (ZYPREXA) 5 MG tablet Take 5-10 mg by mouth See admin instructions. Take one tablet in the morning, take two tablets in the evening.     potassium chloride 20 MEQ TBCR Take 40 mEq by mouth daily. 60 tablet 2   rosuvastatin (CRESTOR) 5 MG tablet Take 5 mg by mouth daily.     triamterene-hydrochlorothiazide (MAXZIDE-25) 37.5-25 MG tablet Take 1 tablet by mouth daily.      No current facility-administered medications on file prior to visit.    Cardiovascular and other pertinent studies:  Echocardiogram 09/15/2021:  Normal LV systolic function with EF 60%. Left ventricle cavity is small.  Normal left ventricular wall thickness. Normal global wall motion. Normal  diastolic filling pattern. Calculated EF 60%.  Left atrial cavity is mildly dilated at 4.3 cm.  Normal RV size and function.  Mild tricuspid regurgitation. RVSP measures 34 mmHg. Personally reviewed echocardiogram images.  Changes as below: Grade 1 diastolic dysfunction, normal left atrial pressure.  EKG 09/01/2021: Sinus rhythm 94 bpm  Low voltage in precordial leads  Recent labs: 01/27/2021: Glucose 82, BUN/Cr 16/0.81. EGFR 85. Na/K 137/3.9. Rest of the CMP normal H/H 13/38. MCV 92. Platelets 267 HbA1C 7.0% Chol 187, TG 87, HDL 72, LDL 69 TSH 1.8 normal   Review of Systems  Cardiovascular:  Positive  for dyspnea on exertion and leg swelling. Negative for chest pain, palpitations and syncope.        Vitals:   10/27/21 1144  BP: (!) 148/80  Pulse: 94  Resp: 16  Temp: 98.5 F (36.9 C)  SpO2: 97%     Body mass index is 38.96 kg/m. Filed Weights   10/27/21 1144  Weight: 213 lb (96.6 kg)     Objective:   Physical Exam Vitals and nursing note reviewed.  Constitutional:      General: She is not in acute distress. Neck:     Vascular: No JVD.  Cardiovascular:     Rate and Rhythm: Normal rate and regular rhythm.      Heart sounds: Normal heart sounds. No murmur heard. Pulmonary:     Effort: Pulmonary effort is normal.     Breath sounds: Normal breath sounds. No wheezing or rales.  Musculoskeletal:     Right lower leg: Edema (2+) present.     Left lower leg: Edema (2+) present.        Assessment & Recommendations:     65 y.o. 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 Exertional dyspnea, leg edema: Echocardiogram shows grade 1 diastolic function.  However, clinically, appears to be HFpEF. Continue Lasix to 40 mg twice daily. Switch triamterene-HCTZ to spironolactone 50 mg daily. With that change, she can stop potassium supplement.   Recommend following up w/PCP DP:[id number].  F/u in 3 months   Nigel Mormon, MD Pager: [phone] Office: [phone]

## 2021-11-15 ENCOUNTER — Other Ambulatory Visit: Payer: Self-pay

## 2021-11-15 ENCOUNTER — Telehealth: Payer: Self-pay | Admitting: Cardiology

## 2021-11-15 ENCOUNTER — Telehealth: Payer: Self-pay

## 2021-11-15 LAB — BASIC METABOLIC PANEL
BUN/Creatinine Ratio: 20 (ref 12–28)
BUN: 28 mg/dL — ABNORMAL HIGH (ref 8–27)
CO2: 28 mmol/L (ref 20–29)
Calcium: 10 mg/dL (ref 8.7–10.3)
Chloride: 85 mmol/L — ABNORMAL LOW (ref 96–106)
Creatinine, Ser: 1.38 mg/dL — ABNORMAL HIGH (ref 0.57–1.00)
Glucose: 92 mg/dL (ref 70–99)
Potassium: 3.8 mmol/L (ref 3.5–5.2)
Sodium: 130 mmol/L — ABNORMAL LOW (ref 134–144)
eGFR: 42 mL/min/{1.73_m2} — ABNORMAL LOW (ref >59)

## 2021-11-15 MED ORDER — TRIAMTERENE-HCTZ 37.5-25 MG PO CAPS: 1.0000 | ORAL_CAPSULE | Freq: Every day | ORAL | 0 refills | Status: DC

## 2021-11-15 NOTE — Telephone Encounter (Signed)
Patient says she went to get blood work done, and she was told she had to hold a medication. She says she doesn't see the med on her medication list, and I do not see it either. Can someone call her and see if they can find which one she is referring to? Her number is [phone].

## 2021-11-15 NOTE — Addendum Note (Signed)
Addended by: Elder Negus on: 11/15/2021 08:01 AM   Modules accepted: Orders

## 2021-11-15 NOTE — Progress Notes (Signed)
Called and spoke to pt, pt voiced understanding.

## 2021-11-15 NOTE — Telephone Encounter (Signed)
In that case, only stop spironolactone and continue triamterene-HCTZ.  Thanks MJP

## 2021-11-15 NOTE — Telephone Encounter (Signed)
Called and spoke to pt. Pt voiced understanding.

## 2021-11-15 NOTE — Progress Notes (Signed)
Attempted to call pt, no answer. Left vm requesting call back.

## 2021-11-15 NOTE — Progress Notes (Signed)
Creatinine (kidney function) and sodium level ahs decreased, most likely as a side effect of spironolactone. Recommend stopping spironolactone. Resume triamterene-HCTZ at the previous dose. Repeat bloodwork in 1 week. Order placed.  Thanks MJP

## 2021-11-15 NOTE — Telephone Encounter (Signed)
Called and gave patient her lab results. However, she seemed extremely confused. I spoke to her daughter and she voiced understanding. She will have the patient stop taking the spironolactone. However, she said that the patient never stopped taking the triamterene-HCTZ 37.5-25mg . Should she still stop the spironolactone and continue the Triamterene-HCTZ? Please advise.   Trenace [phone]

## 2021-11-15 NOTE — Telephone Encounter (Signed)
Called and spoke to pts daughter, she voiced understanding.

## 2021-11-23 LAB — BASIC METABOLIC PANEL
BUN/Creatinine Ratio: 16 (ref 12–28)
BUN: 18 mg/dL (ref 8–27)
CO2: 28 mmol/L (ref 20–29)
Calcium: 10 mg/dL (ref 8.7–10.3)
Chloride: 83 mmol/L — ABNORMAL LOW (ref 96–106)
Creatinine, Ser: 1.1 mg/dL — ABNORMAL HIGH (ref 0.57–1.00)
Glucose: 155 mg/dL — ABNORMAL HIGH (ref 70–99)
Potassium: 2.8 mmol/L — ABNORMAL LOW (ref 3.5–5.2)
Sodium: 128 mmol/L — ABNORMAL LOW (ref 134–144)
eGFR: 55 mL/min/{1.73_m2} — ABNORMAL LOW (ref >59)

## 2021-11-26 NOTE — Progress Notes (Signed)
After stopping spironolactone, Creatinine (marker of kidney function) has improvrd, but now potassium is lower. Recommend resuming potassium at 40 mEq twice daily. Please send script.  Thanks MJP

## 2021-11-28 ENCOUNTER — Other Ambulatory Visit: Payer: Self-pay

## 2021-11-28 MED ORDER — POTASSIUM CHLORIDE CRYS ER 20 MEQ PO TBCR: 40.0000 meq | EXTENDED_RELEASE_TABLET | Freq: Two times a day (BID) | ORAL | 1 refills | Status: DC

## 2021-11-28 NOTE — Progress Notes (Signed)
Pt aware.

## 2022-01-27 ENCOUNTER — Ambulatory Visit: Payer: Medicare Other | Admitting: Cardiology

## 2022-02-01 ENCOUNTER — Other Ambulatory Visit: Payer: Self-pay

## 2022-02-01 ENCOUNTER — Ambulatory Visit: Payer: Medicare Other | Admitting: Cardiology

## 2022-02-01 ENCOUNTER — Encounter: Payer: Self-pay | Admitting: Cardiology

## 2022-02-01 VITALS — BP 135/88 | HR 94 | Temp 97.8°F | Resp 16 | Ht 62.0 in | Wt 206.0 lb

## 2022-02-01 DIAGNOSIS — E871 Hypo-osmolality and hyponatremia: Secondary | ICD-10-CM

## 2022-02-01 DIAGNOSIS — I5032 Chronic diastolic (congestive) heart failure: Secondary | ICD-10-CM

## 2022-02-01 DIAGNOSIS — I10 Essential (primary) hypertension: Secondary | ICD-10-CM

## 2022-02-01 NOTE — Progress Notes (Signed)
Patient referred by Marda Stalker, PA-C for exertional dyspnea  Subjective:   Julia Huff, female    DOB: 12-Apr-1956, 66 y.o.   MRN: [id number]   Chief Complaint  Patient presents with   Hypertension   Congestive Heart Failure   Follow-up    3 month     HPI  66 y.o. 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 Recent Na/K labs not available to me. Her primary complaint remains issues with balance. Leg edema is stable and unchanged.   Initial consultation HPI 08/2021: Patient is here with her sister today.  Patient is retired from [employer], cardiorespiratory due to orthopedic issues.  For last month or so, she has experienced bilateral leg edema and dyspnea with minimal physical activity.  She also has two-pillow orthopnea.  She denies any chest pain symptoms.     Current Outpatient Medications on File Prior to Visit  Medication Sig Dispense Refill   acetaminophen (TYLENOL) 500 MG tablet Take 500 mg by mouth every 6 (six) hours as needed for mild pain.     ARIPiprazole (ABILIFY) 5 MG tablet Take 5 mg by mouth every morning.     Cholecalciferol (VITAMIN D) 2000 units tablet Take 4,000 Units by mouth daily.      divalproex (DEPAKOTE SPRINKLE) 125 MG capsule Take 2 capsules (250 mg total) by mouth 2 (two) times daily. 30 capsule 0   furosemide (LASIX) 40 MG tablet Take 1 tablet (40 mg total) by mouth 2 (two) times daily. 120 tablet 3   gabapentin (NEURONTIN) 100 MG capsule Take 100 mg by mouth 3 (three) times daily.     levothyroxine (SYNTHROID, LEVOTHROID) 75 MCG tablet Take 75 mcg by mouth daily.  0   metformin (FORTAMET) 500 MG (OSM) 24 hr tablet Take 500 mg by mouth daily with breakfast.     Multiple Vitamins-Minerals (MULTIVITAMIN GUMMIES ADULT) CHEW Chew 2 tablets by mouth daily.     OLANZapine  (ZYPREXA) 10 MG tablet Take 1 tablet (10 mg total) by mouth at bedtime. 30 tablet 0   OLANZapine (ZYPREXA) 5 MG tablet Take 5-10 mg by mouth See admin instructions. Take one tablet in the morning, take two tablets in the evening.     potassium chloride SA (KLOR-CON M) 20 MEQ tablet Take 2 tablets (40 mEq total) by mouth 2 (two) times daily. 180 tablet 1   rosuvastatin (CRESTOR) 5 MG tablet Take 5 mg by mouth daily.     triamterene-hydrochlorothiazide (DYAZIDE) 37.5-25 MG capsule Take 1 each (1 capsule total) by mouth daily. 90 capsule 0   No current facility-administered medications on file prior to visit.    Cardiovascular and other pertinent studies:  Echocardiogram 09/15/2021:  Normal LV systolic function with EF 60%. Left ventricle cavity is small.  Normal left ventricular wall thickness. Normal global wall motion. Normal  diastolic filling pattern. Calculated EF 60%.  Left atrial cavity is mildly dilated at 4.3 cm.  Normal RV size and function.  Mild tricuspid regurgitation. RVSP measures 34 mmHg. Personally reviewed echocardiogram images.  Changes as below: Grade 1 diastolic dysfunction, normal left atrial pressure.  EKG 09/01/2021: Sinus rhythm 94 bpm  Low voltage in precordial leads  Recent labs: 01/10/2022: BUN/Cr 18/1.1 eGFR 55.  HbA1C 7.6%    Latest Reference Range & Units 11/14/21 10:35 11/22/21 11:33  Sodium 134 - 144 mmol/L 130 (L) 128 (  L)  Potassium 3.5 - 5.2 mmol/L 3.8 2.8 (L)  Chloride 96 - 106 mmol/L 85 (L) 83 (L)  CO2 20 - 29 mmol/L 28 28  Glucose 70 - 99 mg/dL 92 155 (H)  BUN 8 - 27 mg/dL 28 (H) 18  Creatinine 0.57 - 1.00 mg/dL 1.38 (H) 1.10 (H)  Calcium 8.7 - 10.3 mg/dL 10.0 10.0  BUN/Creatinine Ratio 12 - '28  20 16  ' eGFR >59 mL/min/1.73 42 (L) 55 (L)  (L): Data is abnormally low (H): Data is abnormally high 01/27/2021: Glucose 82, BUN/Cr 16/0.81. EGFR 85. Na/K 137/3.9. Rest of the CMP normal H/H 13/38. MCV 92. Platelets 267 HbA1C 7.0% Chol 187, TG 87,  HDL 72, LDL 69 TSH 1.8 normal   Review of Systems  Cardiovascular:  Positive for dyspnea on exertion and leg swelling. Negative for chest pain, palpitations and syncope.        Vitals:   02/01/22 1059 02/01/22 1100  BP: (!) 144/85 135/88  Pulse: 93 94  Resp: 16   Temp: 97.8 F (36.6 C)   SpO2: 95%      Body mass index is 37.68 kg/m. Filed Weights   02/01/22 1059  Weight: 206 lb (93.4 kg)     Objective:   Physical Exam Vitals and nursing note reviewed.  Constitutional:      General: She is not in acute distress. Neck:     Vascular: No JVD.  Cardiovascular:     Rate and Rhythm: Normal rate and regular rhythm.     Heart sounds: Normal heart sounds. No murmur heard. Pulmonary:     Effort: Pulmonary effort is normal.     Breath sounds: Normal breath sounds. No wheezing or rales.  Musculoskeletal:     Right lower leg: Edema (1+) present.     Left lower leg: Edema (1+) present.        Assessment & Recommendations:   66 y.o. 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  However, clinically, appears to be HFpEF. Continue Lasix to 40 mg twice daily. Not on spironolactone due to hyponatremia Will obtain recent labs from PCP  F/u in 6 months    Nigel Mormon, MD Pager: [phone] Office: [phone]

## 2022-02-06 ENCOUNTER — Other Ambulatory Visit: Payer: Self-pay | Admitting: Cardiology

## 2022-02-06 ENCOUNTER — Telehealth: Payer: Self-pay | Admitting: Diagnostic Neuroimaging

## 2022-02-06 NOTE — Telephone Encounter (Signed)
[name] Inda Merlin (on [place]) physical therapist recommended aquatic therapy so it would not be hard on her joints. Would like a call from the nurse

## 2022-02-09 ENCOUNTER — Other Ambulatory Visit: Payer: Self-pay | Admitting: Cardiology

## 2022-05-10 ENCOUNTER — Other Ambulatory Visit: Payer: Self-pay | Admitting: Cardiology

## 2022-06-28 ENCOUNTER — Other Ambulatory Visit: Payer: Self-pay | Admitting: Cardiology

## 2022-06-28 DIAGNOSIS — R600 Localized edema: Secondary | ICD-10-CM

## 2022-06-28 DIAGNOSIS — I5032 Chronic diastolic (congestive) heart failure: Secondary | ICD-10-CM

## 2022-08-02 ENCOUNTER — Other Ambulatory Visit: Payer: Self-pay | Admitting: Cardiology

## 2022-08-04 ENCOUNTER — Ambulatory Visit: Payer: Medicare Other | Admitting: Cardiology

## 2022-08-21 ENCOUNTER — Telehealth: Payer: Self-pay

## 2022-08-21 ENCOUNTER — Other Ambulatory Visit: Payer: Self-pay

## 2022-08-21 DIAGNOSIS — I10 Essential (primary) hypertension: Secondary | ICD-10-CM

## 2022-08-21 DIAGNOSIS — I5032 Chronic diastolic (congestive) heart failure: Secondary | ICD-10-CM

## 2022-08-21 NOTE — Telephone Encounter (Signed)
Pt daughter called asking why [facility] can not see any of pt orders. Informed pt daughter that are your last office notes, pt would obtain PCP labs. Pt daughter mention that pt has not gone to her PCP in awhile. I went ahead and ordered CBC, CMP, LIPID, AND A1C. Pt has an appt with you on 08/25/2022.

## 2022-08-21 NOTE — Telephone Encounter (Signed)
Thanks MJP  

## 2022-08-22 LAB — CMP14+EGFR
ALT: 7 IU/L (ref 0–32)
AST: 18 IU/L (ref 0–40)
Albumin/Globulin Ratio: 1.3 (ref 1.2–2.2)
Albumin: 4.4 g/dL (ref 3.9–4.9)
Alkaline Phosphatase: 117 IU/L (ref 44–121)
BUN/Creatinine Ratio: 12 (ref 12–28)
BUN: 11 mg/dL (ref 8–27)
Bilirubin Total: 0.3 mg/dL (ref 0.0–1.2)
CO2: 27 mmol/L (ref 20–29)
Calcium: 9.6 mg/dL (ref 8.7–10.3)
Chloride: 92 mmol/L — ABNORMAL LOW (ref 96–106)
Creatinine, Ser: 0.95 mg/dL (ref 0.57–1.00)
Globulin, Total: 3.4 g/dL (ref 1.5–4.5)
Glucose: 95 mg/dL (ref 70–99)
Potassium: 3.3 mmol/L — ABNORMAL LOW (ref 3.5–5.2)
Sodium: 136 mmol/L (ref 134–144)
Total Protein: 7.8 g/dL (ref 6.0–8.5)
eGFR: 65 mL/min/{1.73_m2} (ref >59)

## 2022-08-22 LAB — HEMOGLOBIN A1C
Est. average glucose Bld gHb Est-mCnc: 151 mg/dL
Hgb A1c MFr Bld: 6.9 % — ABNORMAL HIGH (ref 4.8–5.6)

## 2022-08-22 LAB — LIPID PANEL WITH LDL/HDL RATIO
Cholesterol, Total: 177 mg/dL (ref 100–199)
HDL: 77 mg/dL (ref >39)
LDL Chol Calc (NIH): 78 mg/dL (ref 0–99)
LDL/HDL Ratio: 1 ratio (ref 0.0–3.2)
Triglycerides: 126 mg/dL (ref 0–149)
VLDL Cholesterol Cal: 22 mg/dL (ref 5–40)

## 2022-08-22 LAB — CBC WITH DIFFERENTIAL/PLATELET
Basophils Absolute: 0.1 10*3/uL (ref 0.0–0.2)
Basos: 1 %
EOS (ABSOLUTE): 0.1 10*3/uL (ref 0.0–0.4)
Eos: 1 %
Hematocrit: 39.1 % (ref 34.0–46.6)
Hemoglobin: 13 g/dL (ref 11.1–15.9)
Immature Grans (Abs): 0 10*3/uL (ref 0.0–0.1)
Immature Granulocytes: 1 %
Lymphocytes Absolute: 2.4 10*3/uL (ref 0.7–3.1)
Lymphs: 27 %
MCH: 29.7 pg (ref 26.6–33.0)
MCHC: 33.2 g/dL (ref 31.5–35.7)
MCV: 90 fL (ref 79–97)
Monocytes Absolute: 0.5 10*3/uL (ref 0.1–0.9)
Monocytes: 6 %
Neutrophils Absolute: 5.6 10*3/uL (ref 1.4–7.0)
Neutrophils: 64 %
Platelets: 319 10*3/uL (ref 150–450)
RBC: 4.37 x10E6/uL (ref 3.77–5.28)
RDW: 13.9 % (ref 11.7–15.4)
WBC: 8.7 10*3/uL (ref 3.4–10.8)

## 2022-08-25 ENCOUNTER — Ambulatory Visit: Payer: Medicare Other | Admitting: Cardiology

## 2022-08-25 ENCOUNTER — Encounter: Payer: Self-pay | Admitting: Cardiology

## 2022-08-25 VITALS — BP 139/79 | HR 84 | Temp 98.0°F | Resp 16 | Ht 62.0 in | Wt 200.0 lb

## 2022-08-25 DIAGNOSIS — I5032 Chronic diastolic (congestive) heart failure: Secondary | ICD-10-CM

## 2022-08-25 DIAGNOSIS — I10 Essential (primary) hypertension: Secondary | ICD-10-CM

## 2022-08-25 NOTE — Progress Notes (Signed)
Patient referred by Marda Stalker, PA-C for exertional dyspnea  Subjective:   Julia Huff, female    DOB: [birth year], 66 y.o.   MRN: [id number]   Chief Complaint  Patient presents with   Congestive Heart Failure   Follow-up    6 month     HPI  66 y.o. 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 She denies chest pain, shortness of breath, palpitations, leg edema, orthopnea, PND, TIA/syncope. Blood pressure is well controlled.    Initial consultation HPI 08/2021: Patient is here with her sister today.  Patient is retired from [employer], cardiorespiratory due to orthopedic issues.  For last month or so, she has experienced bilateral leg edema and dyspnea with minimal physical activity.  She also has two-pillow orthopnea.  She denies any chest pain symptoms.      Current Outpatient Medications:    acetaminophen (TYLENOL) 500 MG tablet, Take 500 mg by mouth every 6 (six) hours as needed for mild pain., Disp: , Rfl:    ARIPiprazole (ABILIFY) 5 MG tablet, Take 5 mg by mouth every morning., Disp: , Rfl:    Cholecalciferol (VITAMIN D) 2000 units tablet, Take 4,000 Units by mouth daily. , Disp: , Rfl:    divalproex (DEPAKOTE SPRINKLE) 125 MG capsule, Take 2 capsules (250 mg total) by mouth 2 (two) times daily., Disp: 30 capsule, Rfl: 0   furosemide (LASIX) 40 MG tablet, TAKE 1 TABLET(40 MG) BY MOUTH TWICE DAILY, Disp: 120 tablet, Rfl: 3   gabapentin (NEURONTIN) 100 MG capsule, Take 100 mg by mouth 3 (three) times daily., Disp: , Rfl:    levothyroxine (SYNTHROID, LEVOTHROID) 75 MCG tablet, Take 75 mcg by mouth daily., Disp: , Rfl: 0   Multiple Vitamins-Minerals (MULTIVITAMIN GUMMIES ADULT) CHEW, Chew 2 tablets by mouth daily., Disp: , Rfl:    OLANZapine (ZYPREXA) 10 MG tablet, Take 1 tablet (10 mg total) by mouth at bedtime., Disp: 30  tablet, Rfl: 0   OLANZapine (ZYPREXA) 5 MG tablet, Take 5-10 mg by mouth See admin instructions. Take one tablet in the morning, take two tablets in the evening., Disp: , Rfl:    potassium chloride SA (KLOR-CON M) 20 MEQ tablet, TAKE 2 TABLET BY MOUTH TWICE DAILY., Disp: 180 tablet, Rfl: 1   rosuvastatin (CRESTOR) 5 MG tablet, Take 5 mg by mouth daily., Disp: , Rfl:    triamterene-hydrochlorothiazide (DYAZIDE) 37.5-25 MG capsule, TAKE 1 CAPSULE BY MOUTH EVERY DAY, Disp: 90 capsule, Rfl: 0   FARXIGA 10 MG TABS tablet, Take 10 mg by mouth daily., Disp: , Rfl:     Cardiovascular and other pertinent studies:  EKG 08/25/2022: Sinus rhythm 80 bpm  Left atrial enlargement  Echocardiogram 09/15/2021:  Normal LV systolic function with EF 60%. Left ventricle cavity is small.  Normal left ventricular wall thickness. Normal global wall motion. Normal  diastolic filling pattern. Calculated EF 60%.  Left atrial cavity is mildly dilated at 4.3 cm.  Normal RV size and function.  Mild tricuspid regurgitation. RVSP measures 34 mmHg. Personally reviewed echocardiogram images.  Changes as below: Grade 1 diastolic dysfunction, normal left atrial pressure.  Recent labs: 08/21/2022: Glucose 95, BUN/Cr 11/0.95. EGFR 65. Na/K 136/3.3. Rest of the CMP normal H/H 13/39. MCV 90. Platelets 319 Chol 177, TG 126, HDL 77, LDL 78   Review of Systems  Cardiovascular:  Negative for chest pain, dyspnea on exertion, leg  swelling, palpitations and syncope.         Vitals:   08/25/22 1007  BP: (!) 143/93  Pulse: 91  Resp: 16  Temp: 98 F (36.7 C)  SpO2: 95%     Body mass index is 36.58 kg/m. Filed Weights   08/25/22 1007  Weight: 200 lb (90.7 kg)     Objective:   Physical Exam Vitals and nursing note reviewed.  Constitutional:      General: She is not in acute distress. Neck:     Vascular: No JVD.  Cardiovascular:     Rate and Rhythm: Normal rate and regular rhythm.     Heart sounds: Normal  heart sounds. No murmur heard. Pulmonary:     Effort: Pulmonary effort is normal.     Breath sounds: Normal breath sounds. No wheezing or rales.  Musculoskeletal:     Right lower leg: No edema.     Left lower leg: No edema.         Assessment & Recommendations:   66 y.o. 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  Continue Lasix 40 mg twice daily. Not on spironolactone due to hyponatremia Conitnue Farxiga 10 mg daily.  F/u in 1 year    Nigel Mormon, MD Pager: [phone] Office: [phone]

## 2022-09-11 ENCOUNTER — Encounter: Payer: Self-pay | Admitting: Podiatry

## 2022-09-11 ENCOUNTER — Ambulatory Visit: Payer: Medicare Other | Admitting: Podiatry

## 2022-09-11 DIAGNOSIS — M79674 Pain in right toe(s): Secondary | ICD-10-CM

## 2022-09-11 DIAGNOSIS — B351 Tinea unguium: Secondary | ICD-10-CM

## 2022-09-11 DIAGNOSIS — M79675 Pain in left toe(s): Secondary | ICD-10-CM | POA: Diagnosis not present

## 2022-09-13 NOTE — Progress Notes (Signed)
Subjective:   Patient ID: Julia Huff, female   DOB: 66 y.o.   MRN: [id number]   HPI Patient presents stating that the nails of both feet have been thick and and the hallux nails can get loose at times.  They do get tender and are hard to wear shoe gear with comfortably   ROS      Objective:  Physical Exam  Neurovascular status intact thick yellow brittle nailbeds 1-5 both feet that are moderately tender with pressure and especially around the big toenails     Assessment:  Chronic mycotic nail infection with pain 1-5 both feet     Plan:  Today I went ahead and debrided nailbeds 1-5 both feet no angiogenic bleeding instructed

## 2022-10-31 ENCOUNTER — Other Ambulatory Visit: Payer: Self-pay | Admitting: Cardiology

## 2023-01-29 ENCOUNTER — Other Ambulatory Visit: Payer: Self-pay | Admitting: Cardiology

## 2023-02-05 ENCOUNTER — Other Ambulatory Visit: Payer: Self-pay | Admitting: Cardiology

## 2023-02-28 ENCOUNTER — Other Ambulatory Visit: Payer: Self-pay | Admitting: Cardiology

## 2023-02-28 DIAGNOSIS — R600 Localized edema: Secondary | ICD-10-CM

## 2023-02-28 DIAGNOSIS — I5032 Chronic diastolic (congestive) heart failure: Secondary | ICD-10-CM

## 2023-04-25 ENCOUNTER — Other Ambulatory Visit: Payer: Self-pay | Admitting: Cardiology

## 2023-05-08 ENCOUNTER — Other Ambulatory Visit: Payer: Self-pay | Admitting: Cardiology

## 2023-07-25 ENCOUNTER — Other Ambulatory Visit: Payer: Self-pay | Admitting: Cardiology

## 2023-08-06 ENCOUNTER — Other Ambulatory Visit: Payer: Self-pay | Admitting: Cardiology

## 2023-08-31 ENCOUNTER — Ambulatory Visit: Payer: Medicare Other | Admitting: Cardiology

## 2023-09-10 ENCOUNTER — Encounter: Payer: Self-pay | Admitting: Podiatry

## 2023-09-10 ENCOUNTER — Ambulatory Visit: Payer: Medicare Other | Admitting: Podiatry

## 2023-09-10 DIAGNOSIS — G629 Polyneuropathy, unspecified: Secondary | ICD-10-CM | POA: Diagnosis not present

## 2023-09-10 DIAGNOSIS — L600 Ingrowing nail: Secondary | ICD-10-CM | POA: Diagnosis not present

## 2023-09-11 NOTE — Progress Notes (Signed)
Subjective:   Patient ID: Julia Huff, female   DOB: 67 y.o.   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 that has been going on for a while and she is taking gabapentin   ROS      Objective:  Physical Exam  Vascular status intact neurologically diminished with the patient having history of ingrown toenail correction which is done well but still complains of just generalized irritation of that area      Assessment:  Probability that this is related to nail disease but also neuropathy which I really cannot do anything about at this point     Plan:  Unfortunately I do not think there is anything I can really do to help her even though I did discuss with her and her caregiver at length the considerations for nail removal increasing dosage of medication wider shoes with mesh material and will be seen back as needed for more aggressive treatment

## 2023-09-18 ENCOUNTER — Telehealth: Payer: Self-pay | Admitting: Cardiology

## 2023-09-18 NOTE — Telephone Encounter (Signed)
Patient is calling in to get a letter from the dr, that's says it is okay for her to workout two days a week for 30 mins. Please advise

## 2023-09-18 NOTE — Telephone Encounter (Signed)
No contraindication to exercise as tolerated, 2 or more times a week.  Thanks MJP

## 2023-09-24 ENCOUNTER — Encounter: Payer: Self-pay | Admitting: *Deleted

## 2023-09-24 NOTE — Telephone Encounter (Signed)
Letter completed and faxed to Maryagnes Amos as requested.  (Electronically)

## 2023-09-24 NOTE — Telephone Encounter (Signed)
Pt called in stating she needs this in a letter format faxed over to personal trainer.   Attn: Maryagnes Amos - personal trainer  Fax: [phone]

## 2023-09-28 ENCOUNTER — Ambulatory Visit: Payer: Medicare Other | Admitting: Cardiology

## 2023-10-09 ENCOUNTER — Encounter: Payer: Self-pay | Admitting: Cardiology

## 2023-10-09 ENCOUNTER — Ambulatory Visit: Payer: Medicare Other | Attending: Cardiology | Admitting: Cardiology

## 2023-10-09 VITALS — BP 136/74 | HR 80 | Ht 63.0 in | Wt 200.0 lb

## 2023-10-09 DIAGNOSIS — R600 Localized edema: Secondary | ICD-10-CM

## 2023-10-09 DIAGNOSIS — I5032 Chronic diastolic (congestive) heart failure: Secondary | ICD-10-CM | POA: Diagnosis not present

## 2023-10-09 MED ORDER — FUROSEMIDE 40 MG PO TABS
40.0000 mg | ORAL_TABLET | Freq: Every day | ORAL | Status: DC
Start: 2023-10-09 — End: 2023-10-30

## 2023-10-09 NOTE — Progress Notes (Signed)
Cardiology Office Note:  .   Date:  10/09/2023  ID:  Julia Huff, DOB 1956-08-13, MRN [id number] PCP: Jarrett Soho, PA-C  [facility] Providers Cardiologist:  Truett Mainland, MD PCP: Jarrett Soho, PA-C  Chief Complaint  Patient presents with   HFpEF      History of Present Illness: .    Julia Huff is a 67 y.o. 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  She continues to gain weight, but admits to snacking several times through the day.  She is working with a Systems analyst and hopes to get off the walker and walk regularly.  Vitals:   10/09/23 1106  BP: 136/74  Pulse: 80  SpO2: 98%     ROS:  Review of Systems  Cardiovascular:  Positive for dyspnea on exertion. Negative for chest pain, leg swelling, palpitations and syncope.     Studies Reviewed: Marland Kitchen       EKG 10/09/2023: Normal sinus rhythm Nonspecific T wave abnormality When compared with ECG of 13-Dec-2007 19:36, Vent. rate has increased BY  38 BPM Nonspecific T wave abnormality now evident in QTc is difficult to interpret due to flat T wave      Echocardiogram 09/15/2021:  Normal LV systolic function with EF 60%. Left ventricle cavity is small.  Normal left ventricular wall thickness. Normal global wall motion. Normal  diastolic filling pattern. Calculated EF 60%.  Left atrial cavity is mildly dilated at 4.3 cm.  Normal RV size and function.  Mild tricuspid regurgitation. RVSP measures 34 mmHg. Personally reviewed echocardiogram images.  Changes as below: Grade 1 diastolic dysfunction, normal left atrial pressure.   Recent labs: 10/01/2023: Glucose 96, BUN/Cr 14/0.9. EGFR 67. Na/K K 3.8. Hb 13.0 HbA1C 7.0% Chol 172, TG 190, HDL 68, LDL 73 TSH 1.9 normal  08/21/2022: Glucose 95, BUN/Cr 11/0.95.  EGFR 65. Na/K 136/3.3. Rest of the CMP normal H/H 13/39. MCV 90. Platelets 319 Chol 177, TG 126, HDL 77, LDL 78    Physical Exam:   Physical Exam Vitals and nursing note reviewed.  Constitutional:      General: She is not in acute distress. Neck:     Vascular: No JVD.  Cardiovascular:     Rate and Rhythm: Normal rate and regular rhythm.     Heart sounds: Normal heart sounds. No murmur heard. Pulmonary:     Effort: Pulmonary effort is normal.     Breath sounds: Normal breath sounds. No wheezing or rales.  Musculoskeletal:     Right lower leg: No edema.     Left lower leg: No edema.      VISIT DIAGNOSES:   ICD-10-CM   1. Chronic heart failure with preserved ejection fraction (HCC)  I50.32 EKG 12-Lead    furosemide (LASIX) 40 MG tablet    2. Leg edema  R60.0 furosemide (LASIX) 40 MG tablet       ASSESSMENT AND PLAN: .    Julia Huff is a 67 y.o. 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  QTc is difficult to a certain today due to flat T waves.  Computer read suggests QTc of 573 ms.  I doubt it is that long.  Nonetheless, we will reduce Lasix to 40  mg daily and repeat EKG in 6 weeks in the meantime, I encouraged her to wear compression stockings regularly, as some of her leg edema could be due to venous insufficiency. Not on spironolactone due to hyponatremia Conitnue Farxiga 10 mg daily.   Weight gain: Related to dietary indiscretion.  Discussed avoiding late night snacks and regular walking.  Meds ordered this encounter  Medications   furosemide (LASIX) 40 MG tablet    Sig: Take 1 tablet (40 mg total) by mouth daily.      F/u in 6 weeks with repeat EKG  Signed, Elder Negus, MD

## 2023-10-09 NOTE — Patient Instructions (Signed)
Medication Instructions:  Your physician has recommended you make the following change in your medication:  1-Decrease furosemide (lasix) 40 mg by mouth daily.  *If you need a refill on your cardiac medications before your next appointment, please call your pharmacy*  Lab Work: If you have labs (blood work) drawn today and your tests are completely normal, you will receive your results only by: MyChart Message (if you have MyChart) OR A paper copy in the mail If you have any lab test that is abnormal or we need to change your treatment, we will call you to review the results.  Follow-Up: At [facility], you and your health needs are our priority.  As part of our continuing mission to provide you with exceptional heart care, we have created designated Provider Care Teams.  These Care Teams include your primary Cardiologist (physician) and Advanced Practice Providers (APPs -  Physician Assistants and Nurse Practitioners) who all work together to provide you with the care you need, when you need it.  We recommend signing up for the patient portal called "MyChart".  Sign up information is provided on this After Visit Summary.  MyChart is used to connect with patients for Virtual Visits (Telemedicine).  Patients are able to view lab/test results, encounter notes, upcoming appointments, etc.  Non-urgent messages can be sent to your provider as well.   To learn more about what you can do with MyChart, go to [email or web address].    Your next appointment:   6 week(s) with EKG  Provider:   Elder Negus, MD     Other Instructions  Diet & Lifestyle recommendations:  Physical activity recommendation (The Physical Activity Guidelines for Americans. JAMA 2018;Nov 12) At least 150-300 minutes a week of moderate-intensity, or 75-150 minutes a week of vigorous-intensity aerobic physical activity, or an equivalent combination of moderate- and vigorous-intensity aerobic activity. Adults  should perform muscle-strengthening activities on 2 or more days a week. Older adults should do multicomponent physical activity that includes balance training as well as aerobic and muscle-strengthening activities. Benefits of increased physical activity include lower risk of mortality including cardiovascular mortality, lower risk of cardiovascular events and associated risk factors (hypertension and diabetes), and lower risk of many cancers (including bladder, breast, colon, endometrium, esophagus, kidney, lung, and stomach). Additional improvments have been seen in cognition, risk of dementia, anxiety and depression, improved bone health, lower risk of falls, and associated injuries.  Dietary recommendation The 2019 ACC/AHA guidelines promote nutrition as a main fixture of cardiovascular wellness, with a recommendation for a varied diet of fruit, vegetables, fish, legumes, and whole grains (Class I), as well as recommendations to reduce sodium, cholesterol, processed meats, and refined sugars (Class IIa recommendation).10 Sodium intake, a topic of some controversy as of late, is recommended to be kept at 1,500 mg/day or less, far below the average daily intake in the [place] of 3,409 mg/day, and notably below that of previous [place] recommendations for 300mg /day.10,11 For those unable to reach 1,500 mg/day, they recommend at least a reduction of 1000 mg/day.  A Pesco-Mediterranean Diet With Intermittent Fasting: JACC Review Topic of the Week. J Am Coll Cardiol 2020;76:1484-1493 Pesco-Mediterranean diet, it is supplemented with extra-virgin olive oil (EVOO), which is the principle fat source, along with moderate amounts of dairy (particularly yogurt and cheese) and eggs, as well as modest amounts of alcohol consumption (ideally red wine with the evening meal), but few red and processed meats.

## 2023-10-29 ENCOUNTER — Other Ambulatory Visit: Payer: Self-pay | Admitting: Cardiology

## 2023-10-29 DIAGNOSIS — R600 Localized edema: Secondary | ICD-10-CM

## 2023-10-29 DIAGNOSIS — I5032 Chronic diastolic (congestive) heart failure: Secondary | ICD-10-CM

## 2023-11-09 ENCOUNTER — Other Ambulatory Visit: Payer: Self-pay | Admitting: Cardiology

## 2023-11-19 ENCOUNTER — Ambulatory Visit: Payer: Medicare Other

## 2023-11-19 ENCOUNTER — Ambulatory Visit: Payer: Medicare Other | Attending: Cardiology | Admitting: Cardiology

## 2023-11-19 ENCOUNTER — Encounter: Payer: Self-pay | Admitting: Cardiology

## 2023-11-19 VITALS — BP 122/76 | HR 87 | Ht 63.0 in | Wt 204.0 lb

## 2023-11-19 DIAGNOSIS — R9431 Abnormal electrocardiogram [ECG] [EKG]: Secondary | ICD-10-CM | POA: Insufficient documentation

## 2023-11-19 DIAGNOSIS — I10 Essential (primary) hypertension: Secondary | ICD-10-CM

## 2023-11-19 NOTE — Progress Notes (Signed)
  Cardiology Office Note:  .   Date:  11/19/2023  ID:  Pecolia Ades, DOB Aug 19, 1956, MRN [id number] PCP: Jarrett Soho, PA-C  [facility] Providers Cardiologist:  Truett Mainland, MD PCP: Jarrett Soho, PA-C  Chief Complaint  Patient presents with   Abnormal ECG           History of Present Illness: .    AVARAE LENGERICH is a 67 y.o. 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     Studies Reviewed: Marland Kitchen        EKG 11/19/2023: Normal sinus rhythm Cannot rule out Inferior infarct , age undetermined When compared with ECG of 09-Oct-2023 11:09, QT has shortened      Physical Exam:   Physical Exam Vitals and nursing note reviewed.  Constitutional:      General: She is not in acute distress. Neck:     Vascular: No JVD.  Cardiovascular:     Rate and Rhythm: Normal rate and regular rhythm.     Heart sounds: Normal heart sounds. No murmur heard. Pulmonary:     Effort: Pulmonary effort is normal.     Breath sounds: Normal breath sounds. No wheezing or rales.  Musculoskeletal:     Right lower leg: Edema (Trace) present.     Left lower leg: Edema (Trace) present.      VISIT DIAGNOSES:   ICD-10-CM   1. Essential hypertension  I10     2. Abnormal EKG  R94.31 EKG 12-Lead       ASSESSMENT AND PLAN: .    KANDA ELRICK is a 67 y.o. 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  QT has shortened, now with normal limits. Continue Lasix at 40 mg daily.Not on spironolactone due to hyponatremia Conitnue Farxiga 10 mg  daily.         No orders of the defined types were placed in this encounter.    F/u in 1 year  Signed, Elder Negus, MD

## 2023-11-19 NOTE — Patient Instructions (Signed)
Medication Instructions:   Your physician recommends that you continue on your current medications as directed. Please refer to the Current Medication list given to you today.  *If you need a refill on your cardiac medications before your next appointment, please call your pharmacy*    Follow-Up: At [facility], you and your health needs are our priority.  As part of our continuing mission to provide you with exceptional heart care, we have created designated Provider Care Teams.  These Care Teams include your primary Cardiologist (physician) and Advanced Practice Providers (APPs -  Physician Assistants and Nurse Practitioners) who all work together to provide you with the care you need, when you need it.  We recommend signing up for the patient portal called "MyChart".  Sign up information is provided on this After Visit Summary.  MyChart is used to connect with patients for Virtual Visits (Telemedicine).  Patients are able to view lab/test results, encounter notes, upcoming appointments, etc.  Non-urgent messages can be sent to your provider as well.   To learn more about what you can do with MyChart, go to [email or web address].    Your next appointment:   1 year(s)  Provider:   Elder Negus, MD

## 2024-01-09 DIAGNOSIS — Z6838 Body mass index (BMI) 38.0-38.9, adult: Secondary | ICD-10-CM | POA: Diagnosis not present

## 2024-01-09 DIAGNOSIS — G629 Polyneuropathy, unspecified: Secondary | ICD-10-CM | POA: Diagnosis not present

## 2024-01-09 DIAGNOSIS — I5032 Chronic diastolic (congestive) heart failure: Secondary | ICD-10-CM | POA: Diagnosis not present

## 2024-01-09 DIAGNOSIS — Z0000 Encounter for general adult medical examination without abnormal findings: Secondary | ICD-10-CM | POA: Diagnosis not present

## 2024-01-09 DIAGNOSIS — E1140 Type 2 diabetes mellitus with diabetic neuropathy, unspecified: Secondary | ICD-10-CM | POA: Diagnosis not present

## 2024-01-09 DIAGNOSIS — F319 Bipolar disorder, unspecified: Secondary | ICD-10-CM | POA: Diagnosis not present

## 2024-01-09 DIAGNOSIS — D509 Iron deficiency anemia, unspecified: Secondary | ICD-10-CM | POA: Diagnosis not present

## 2024-01-09 DIAGNOSIS — E039 Hypothyroidism, unspecified: Secondary | ICD-10-CM | POA: Diagnosis not present

## 2024-01-09 DIAGNOSIS — I10 Essential (primary) hypertension: Secondary | ICD-10-CM | POA: Diagnosis not present

## 2024-01-24 DIAGNOSIS — F251 Schizoaffective disorder, depressive type: Secondary | ICD-10-CM | POA: Diagnosis not present

## 2024-02-19 ENCOUNTER — Other Ambulatory Visit: Payer: Self-pay | Admitting: Cardiology

## 2024-03-07 DIAGNOSIS — U071 COVID-19: Secondary | ICD-10-CM | POA: Diagnosis not present

## 2024-04-22 DIAGNOSIS — F251 Schizoaffective disorder, depressive type: Secondary | ICD-10-CM | POA: Diagnosis not present

## 2024-05-07 DIAGNOSIS — R29890 Loss of height: Secondary | ICD-10-CM | POA: Diagnosis not present

## 2024-05-07 DIAGNOSIS — Z1231 Encounter for screening mammogram for malignant neoplasm of breast: Secondary | ICD-10-CM | POA: Diagnosis not present

## 2024-05-07 DIAGNOSIS — M8588 Other specified disorders of bone density and structure, other site: Secondary | ICD-10-CM | POA: Diagnosis not present

## 2024-05-21 DIAGNOSIS — E1140 Type 2 diabetes mellitus with diabetic neuropathy, unspecified: Secondary | ICD-10-CM | POA: Diagnosis not present

## 2024-07-09 DIAGNOSIS — G629 Polyneuropathy, unspecified: Secondary | ICD-10-CM | POA: Diagnosis not present

## 2024-07-09 DIAGNOSIS — E1140 Type 2 diabetes mellitus with diabetic neuropathy, unspecified: Secondary | ICD-10-CM | POA: Diagnosis not present

## 2024-07-09 DIAGNOSIS — I10 Essential (primary) hypertension: Secondary | ICD-10-CM | POA: Diagnosis not present

## 2024-07-09 DIAGNOSIS — D509 Iron deficiency anemia, unspecified: Secondary | ICD-10-CM | POA: Diagnosis not present

## 2024-07-09 DIAGNOSIS — Z6838 Body mass index (BMI) 38.0-38.9, adult: Secondary | ICD-10-CM | POA: Diagnosis not present

## 2024-07-09 DIAGNOSIS — F319 Bipolar disorder, unspecified: Secondary | ICD-10-CM | POA: Diagnosis not present

## 2024-07-09 DIAGNOSIS — E039 Hypothyroidism, unspecified: Secondary | ICD-10-CM | POA: Diagnosis not present

## 2024-07-09 DIAGNOSIS — I5032 Chronic diastolic (congestive) heart failure: Secondary | ICD-10-CM | POA: Diagnosis not present

## 2024-07-11 DIAGNOSIS — E039 Hypothyroidism, unspecified: Secondary | ICD-10-CM | POA: Diagnosis not present

## 2024-07-11 DIAGNOSIS — E1140 Type 2 diabetes mellitus with diabetic neuropathy, unspecified: Secondary | ICD-10-CM | POA: Diagnosis not present

## 2024-07-11 DIAGNOSIS — I10 Essential (primary) hypertension: Secondary | ICD-10-CM | POA: Diagnosis not present

## 2024-07-14 DIAGNOSIS — F251 Schizoaffective disorder, depressive type: Secondary | ICD-10-CM | POA: Diagnosis not present

## 2024-07-31 ENCOUNTER — Telehealth: Payer: Self-pay

## 2024-07-31 DIAGNOSIS — E1169 Type 2 diabetes mellitus with other specified complication: Secondary | ICD-10-CM

## 2024-07-31 DIAGNOSIS — F319 Bipolar disorder, unspecified: Secondary | ICD-10-CM

## 2024-07-31 DIAGNOSIS — I5032 Chronic diastolic (congestive) heart failure: Secondary | ICD-10-CM

## 2024-08-22 ENCOUNTER — Telehealth: Payer: Self-pay | Admitting: *Deleted

## 2024-08-26 NOTE — Progress Notes (Signed)
 Complex Care Management Note  Care Guide Note 08/26/2024 Name: Julia Huff MRN: [id number] DOB: [birth year]  Julia Huff is a 68 y.o. year old female who sees Katina Pfeiffer, [place] for primary care. I reached out to Channing DELENA Blush by phone today to offer complex care management services.  Ms. Kary was given information about Complex Care Management services today including:   The Complex Care Management services include support from the care team which includes your Nurse Care Manager, Clinical Social Worker, or Pharmacist.  The Complex Care Management team is here to help remove barriers to the health concerns and goals most important to you. Complex Care Management services are voluntary, and the patient may decline or stop services at any time by request to their care team member.   Complex Care Management Consent Status: Patient agreed to services and verbal consent obtained.   Follow up plan:  Telephone appointment with complex care management team member scheduled for:  09/01/24  Encounter Outcome:  Patient Scheduled Harlene Satterfield  [facility]  [facility], [facility] Guide  Direct Dial: [phone]  Fax [phone]

## 2024-09-01 ENCOUNTER — Other Ambulatory Visit: Payer: Self-pay

## 2024-09-01 NOTE — Patient Instructions (Signed)
 Visit Information  Thank you for taking time to visit with me today. Please don't hesitate to contact me if I can be of assistance to you before our next scheduled appointment.  Our next appointment is by telephone on 09/15/24 at 10 AM Please call the care guide team at [phone] if you need to cancel or reschedule your appointment.   Following is a copy of your care plan:   Goals Addressed             This Visit's Progress    VBCI RN Care Plan   On track    Problems:  Chronic Disease Management support and education needs related to CHF, DMII, and HTN  Goal: Over the next 30 days the Patient will demonstrate Ongoing health management independence as evidenced by incorporating regular exercise into her routine and following a healthy diet as recommended by provider        take all medications exactly as prescribed and will call provider for medication related questions as evidenced by patient report of medication compliance    verbalize understanding of plan for management of CHF as evidenced by patient report of weighing daily and verbalizing the rationale for weighing daily as well as signs of fluid retention  Interventions:   Heart Failure Interventions: Provided education on low sodium diet Assessed need for readable accurate scales in home Provided education about placing scale on hard, flat surface Advised patient to weigh each morning after emptying bladder Discussed importance of daily weight and advised patient to weigh and record daily Reviewed role of diuretics in prevention of fluid overload and management of heart failure; Discussed the importance of keeping all appointments with provider Provided patient with education about the role of exercise in the management of heart failure Screening for signs and symptoms of depression related to chronic disease state  Assessed social determinant of health barriers   Diabetes Interventions: Assessed patient's  understanding of A1c goal: <7% Reviewed medications with patient and discussed importance of medication adherence Counseled on importance of regular laboratory monitoring as prescribed Discussed plans with patient for ongoing care management follow up and provided patient with direct contact information for care management team Lab Results  Component Value Date   HGBA1C 6.9 (H) 08/21/2022    Hypertension Interventions: Last practice recorded BP readings:  BP Readings from Last 3 Encounters:  11/19/23 122/76  10/09/23 136/74  08/25/22 139/79   Most recent eGFR/CrCl:  Lab Results  Component Value Date   EGFR 65 08/21/2022    No components found for: CRCL  Evaluation of current treatment plan related to hypertension self management and patient's adherence to plan as established by provider Reviewed medications with patient and discussed importance of compliance Advised patient, providing education and rationale, to monitor blood pressure daily and record, calling PCP for findings outside established parameters  Patient Self-Care Activities:  Attend all scheduled provider appointments Call provider office for new concerns or questions  Perform all self care activities independently  Perform IADL's (shopping, preparing meals, housekeeping, managing finances) independently Take medications as prescribed   call office if I gain more than 2 pounds in one day or 5 pounds in one week use salt in moderation watch for swelling in feet, ankles and legs every day weigh myself daily bring diary to all appointments check feet daily for cuts, sores or redness check blood pressure weekly write blood pressure results in a log or diary take blood pressure log to all doctor appointments call doctor for  signs and symptoms of high blood pressure take medications for blood pressure exactly as prescribed  Plan:  Telephone follow up appointment with care management team member scheduled for:   09/15/24 at 10 AM             Please call the Suicide and Crisis Lifeline: 988 call 1-800-273-TALK (toll free, 24 hour hotline) if you are experiencing a Mental Health or Behavioral Health Crisis or need someone to talk to.  The patient verbalized understanding of instructions, educational materials, and care plan provided today and DECLINED offer to receive copy of patient instructions, educational materials, and care plan.   Rosaline Finlay, RN MSN [place]  [facility] Population Health RN Care Manager Direct Dial: [phone]  Fax: [phone]

## 2024-09-01 NOTE — Patient Outreach (Signed)
 Complex Care Management   Visit Note  09/01/2024  Name:  Julia Huff MRN: [id number] DOB: Jun 18, 1956  Situation: Referral received for Complex Care Management related to Heart Failure and Diabetes I obtained verbal consent from Patient.  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    Assessment: Patient Reported Symptoms:  Cognitive Cognitive Status: Able to follow simple commands, Alert and oriented to person, place, and time, Normal speech and language skills Cognitive/Intellectual Conditions Management [RPT]: None reported or documented in medical history or problem list   Health Maintenance Behaviors: Annual physical exam, Exercise  Neurological Neurological Review of Symptoms: Numbness (Neuropathy) Neurological Management Strategies: Routine screening, Medication therapy, Exercise  HEENT HEENT Symptoms Reported: No symptoms reported HEENT Management Strategies: Routine screening, Medical device (Eyelgasses)    Cardiovascular Cardiovascular Symptoms Reported: Swelling in legs or feet (Swelling R>L in the ankle) Does patient have uncontrolled Hypertension?: No (Confirmed patient has BP monitor) Cardiovascular Management Strategies: Medication therapy, Routine screening, Exercise, Weight management Do You Have a Working Readable Scale?: Yes Weight: 206 lb (93.4 kg) (Patient reported) Cardiovascular Comment: Patient notes she does not weigh daily. She states she may weigh herself once a week.  Respiratory Respiratory Symptoms Reported: No symptoms reported    Endocrine Endocrine Symptoms Reported: No symptoms  reported Is patient diabetic?: Yes Is patient checking blood sugars at home?: No Endocrine Comment: Patient reports she has not been told she needs to check her blood sugar at home.  Gastrointestinal Gastrointestinal Symptoms Reported: No symptoms reported Additional Gastrointestinal Details: Patient reports a good appetite. She reports she is working on losing weight. Patient reports regular BMs, last BM yesterday. Gastrointestinal Management Strategies: Diet modification, Exercise    Genitourinary Genitourinary Symptoms Reported: Frequency, Urgency Additional Genitourinary Details: Frequency and urgency related to diuretic. Patient reports she wears depends in the event of urge incontinence Genitourinary Management Strategies: Medication therapy, Incontinence garment/pad  Integumentary Integumentary Symptoms Reported: No symptoms reported    Musculoskeletal Musculoskelatal Symptoms Reviewed: Unsteady gait, Difficulty walking, Joint pain Additional Musculoskeletal Details: Arthritis R knee. Patient reports she will be getting an injection 09/08/24. Otherwise she uses Tylenol , biofreeze, and icy hot. Musculoskeletal Management Strategies: Medical device, Exercise, Medication therapy (Walker and cane) Musculoskeletal Comment: Patient notes that she was previously seeing PT and plans to restart this fall. She exercises by walking in her driveway and home exercises. Falls in the past year?: No Number of falls in past year: 1 or less Was there an injury with Fall?: No Fall Risk Category Calculator: 0 Patient Fall Risk Level: Low Fall Risk Patient at Risk for Falls Due to: Impaired balance/gait Fall risk Follow up: Falls evaluation completed, Education provided, Falls prevention discussed  Psychosocial Psychosocial Symptoms Reported: No symptoms reported Behavioral Management Strategies: Medication therapy, Support system Behavioral Health Comment: Patient reports that she has a psychiatrist that  she follows with regularly following brain aneurysm over 10 years ago Techniques to [place] with Loss/Stress/Change: Medication, Support group Quality of Family Relationships: helpful, involved, supportive Do you feel physically threatened by others?: No    09/01/2024    PHQ2-9  Depression Screening   Little interest or pleasure in doing things Not at all  Feeling down, depressed, or hopeless Not at all  PHQ-2 - Total Score 0  Trouble falling or staying asleep, or sleeping too much    Feeling tired or having little energy    Poor appetite or overeating     Feeling bad about yourself - or that you are a failure or have let yourself or your family down    Trouble concentrating on things, such as reading the newspaper or watching television    Moving or speaking so slowly that other people could have noticed.  Or the opposite - being so fidgety or restless that you have been moving around a lot more than usual    Thoughts that you would be better off dead, or hurting yourself in some way    PHQ2-9 Total Score    If you checked off any problems, how difficult have these problems made it for you to do your work, take care of things at home, or get along with other people    Depression Interventions/Treatment      There were no vitals filed for this visit.  Medications Reviewed Today     Reviewed by Arno Rosaline SQUIBB, RN (Registered Nurse) on 09/01/24 at 1212  Med List Status: <None>   Medication Order Taking? Sig Documenting Provider Last Dose Status Informant  acetaminophen  (TYLENOL ) 500 MG tablet [id number] Yes Take 500 mg by mouth every 6 (six) hours as needed for mild pain. [provider]  Active Family Member  ARIPiprazole (ABILIFY) 5 MG tablet [id number] Yes Take 5 mg by mouth every morning. [provider]  Active   Cholecalciferol (VITAMIN D) 2000 units tablet [id number] Yes Take 4,000 Units by mouth daily.  [provider]  Active Family Member  cyanocobalamin  (VITAMIN B12) 250 MCG tablet [id number] Yes Take 250 mcg by mouth 2 (two) times a week. [provider]  Active   divalproex  (DEPAKOTE  SPRINKLE) 125 MG capsule [id number] Yes Take 2 capsules (250 mg total) by mouth 2 (two) times daily. Moishe Bernadette BRAVO, NP  Active   empagliflozin (JARDIANCE) 10 MG TABS tablet [id number] Yes Take 10 mg by mouth daily. [provider]  Active   FARXIGA 10 MG TABS tablet [id number]  Take 10 mg by mouth daily.  Patient not taking: Reported on 09/01/2024   [provider]  Consider Medication Status and Discontinue (Discontinued by provider)   furosemide  (LASIX ) 40 MG tablet [id number] Yes Take 1 tablet (40 mg total) by mouth daily. Patwardhan, Newman PARAS, MD  Active   gabapentin (NEURONTIN) 100 MG capsule [id number] Yes Take 100 mg by mouth at bedtime. [provider]  Active   Iron, Ferrous Sulfate, 325 (65 Fe) MG TABS [id number] Yes 2 (two) times a week. [provider]  Active   levothyroxine  (SYNTHROID , LEVOTHROID) 75 MCG tablet [id number] Yes Take 75 mcg by mouth daily. [provider]  Active Family Member           Med Note BEVERLEE, REYES ORN   Sat Apr 01, 2016 12:47 AM)    Multiple Vitamins-Minerals (MULTIVITAMIN GUMMIES ADULT) CICERO [id number] Yes Chew 2 tablets by mouth daily. [provider]  Active Family Member  OLANZapine  (ZYPREXA ) 2.5 MG tablet [id number] Yes Take 2.5 mg by mouth at bedtime. [provider]  Active   potassium chloride  SA (KLOR-CON  M) 20 MEQ tablet [id number]  TAKE 2 TABLET BY MOUTH TWICE DAILY.  Patient not taking: Reported on 09/01/2024   [place]ira Newman PARAS, MD  Consider Medication Status and Discontinue (Duplicate)   potassium chloride  SA (KLOR-CON  M) 20 MEQ tablet [id number] Yes TAKE 2 TABLETS(40 MEQ) BY MOUTH DAILY Patwardhan, Manish J, MD  Active   rosuvastatin (CRESTOR) 5 MG tablet [id number] Yes Take 10 mg by mouth daily. [provider]  Active   SODIUM FLUORIDE 5000  PPM 1.1 % PSTE [id number] Yes Take by mouth. [provider]  Active   triamterene -hydrochlorothiazide  (DYAZIDE ) 37.5-25 MG capsule [id number] Yes TAKE 1 CAPSULE BY MOUTH EVERY DAY Patwardhan, Newman PARAS, MD  Active             Recommendation:   Continue Current Plan of Care  Follow Up Plan:   Telephone follow up appointment date/time:  09/15/24 at 10 AM  Rosaline Finlay, RN MSN [place]  [facility] RN Care Manager Direct Dial: [phone]  Fax: [phone]

## 2024-09-08 DIAGNOSIS — M1712 Unilateral primary osteoarthritis, left knee: Secondary | ICD-10-CM | POA: Diagnosis not present

## 2024-09-08 DIAGNOSIS — M1711 Unilateral primary osteoarthritis, right knee: Secondary | ICD-10-CM | POA: Diagnosis not present

## 2024-09-08 DIAGNOSIS — M170 Bilateral primary osteoarthritis of knee: Secondary | ICD-10-CM | POA: Diagnosis not present

## 2024-09-08 DIAGNOSIS — M25561 Pain in right knee: Secondary | ICD-10-CM | POA: Diagnosis not present

## 2024-09-15 ENCOUNTER — Other Ambulatory Visit: Payer: Self-pay

## 2024-09-15 NOTE — Patient Instructions (Signed)
 Visit Information  Thank you for taking time to visit with me today. Please don't hesitate to contact me if I can be of assistance to you before our next scheduled appointment.  Your next care management appointment is by telephone on 10/01/24 at 10:30 AM  Please call the care guide team at [phone] if you need to cancel, schedule, or reschedule an appointment.   Please call the Suicide and Crisis Lifeline: 988 call 1-800-273-TALK (toll free, 24 hour hotline) if you are experiencing a Mental Health or Behavioral Health Crisis or need someone to talk to.  Rosaline Finlay, RN MSN [place]  [facility] Population Health RN Care Manager Direct Dial: [phone]  Fax: [phone]

## 2024-09-15 NOTE — Patient Outreach (Signed)
 Complex Care Management   Visit Note  09/15/2024  Name:  Julia Huff MRN: [id number] DOB: [birth year]  Situation: Referral received for Complex Care Management related to Heart Failure, Diabetes with Complications, and HTN I obtained verbal consent from Patient.  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    Assessment: Patient Reported Symptoms:  Cognitive Cognitive Status: Able to follow simple commands, Alert and oriented to person, place, and time, Normal speech and language skills Cognitive/Intellectual Conditions Management [RPT]: None reported or documented in medical history or problem list      Neurological Neurological Review of Symptoms: Numbness (Neuropathy) Neurological Management Strategies: Exercise, Medication therapy, Routine screening  HEENT HEENT Symptoms Reported: Not assessed      Cardiovascular Cardiovascular Symptoms Reported: Swelling in legs or feet (Swelling at baseline per patient) Does patient have uncontrolled Hypertension?: No Cardiovascular Management Strategies: Medication therapy, Routine screening, Exercise, Weight management Do You Have a Working Readable Scale?: Yes Cardiovascular Comment: Patient reports that she has been trying to weight herself but has a hard time getting an accurate weight because she has to hold onto the dresser to steady herself. She notes that she has appointment with cardiology next month  Respiratory Respiratory Symptoms Reported: No symptoms reported    Endocrine Endocrine Symptoms Reported: No symptoms reported Is patient diabetic?: Yes Is patient  checking blood sugars at home?: No    Gastrointestinal Gastrointestinal Symptoms Reported: No symptoms reported Gastrointestinal Management Strategies: Diet modification, Exercise Gastrointestinal Comment: Patient reports she received a letter saying that it is time to get her colonoscopy. She still needs to call to schedule    Genitourinary Genitourinary Symptoms Reported: Frequency, Urgency Additional Genitourinary Details: Frequency and urgency related to diuretic Genitourinary Management Strategies: Medication therapy, Incontinence garment/pad  Integumentary Integumentary Symptoms Reported: Not assessed    Musculoskeletal Musculoskelatal Symptoms Reviewed: Unsteady gait, Difficulty walking, Joint pain Additional Musculoskeletal Details: Patient reports she did receive injection in her R knee 09/08/24. She is waiting to see if she will be approved for gel shot. Patient reports injection has helped with pain relief. She reports she is also able to stand up straight since injection, where before she wasn't able to Musculoskeletal Management Strategies: Medical device, Exercise, Medication therapy (Walker and cane) Musculoskeletal Comment: Patient reports she has not called PT yet to restart therapy. She also plans to contact her personal trainer Falls in the past year?: No Number of falls in past year: 1 or less Was there an injury with Fall?: No Fall Risk Category Calculator: 0 Patient Fall Risk Level: Low Fall Risk Patient at Risk for Falls Due to: Impaired balance/gait Fall risk Follow up: Falls evaluation completed, Education provided, Falls prevention discussed  Psychosocial Psychosocial Symptoms Reported: Not assessed          09/15/2024    PHQ2-9 Depression Screening   Little interest or pleasure in doing things    Feeling down, depressed, or hopeless    PHQ-2 - Total Score    Trouble falling or staying asleep, or sleeping too much    Feeling tired or having little energy  Poor appetite or overeating     Feeling bad about yourself - or that you are a failure or have let yourself or your family down    Trouble concentrating on things, such as reading the newspaper or watching television    Moving or speaking so slowly that other people could have noticed.  Or the opposite - being so fidgety or restless that you have been moving around a lot more than usual    Thoughts that you would be better off dead, or hurting yourself in some way    PHQ2-9 Total Score    If you checked off any problems, how difficult have these problems made it for you to do your work, take care of things at home, or get along with other people    Depression Interventions/Treatment      There were no vitals filed for this visit.  Medications Reviewed Today     Reviewed by Arno Rosaline SQUIBB, RN (Registered Nurse) on 09/15/24 at 1004  Med List Status: <None>   Medication Order Taking? Sig Documenting Provider Last Dose Status Informant  acetaminophen  (TYLENOL ) 500 MG tablet [id number]  Take 500 mg by mouth every 6 (six) hours as needed for mild pain. [provider]  Active Family Member  ARIPiprazole (ABILIFY) 5 MG tablet [id number]  Take 5 mg by mouth every morning. [provider]  Active   Cholecalciferol (VITAMIN D) 2000 units tablet [id number]  Take 4,000 Units by mouth daily.  [provider]  Active Family Member  cyanocobalamin (VITAMIN B12) 250 MCG tablet [id number]  Take 250 mcg by mouth 2 (two) times a week. [provider]  Active   divalproex  (DEPAKOTE  SPRINKLE) 125 MG capsule [id number]  Take 2 capsules (250 mg total) by mouth 2 (two) times daily. Moishe Bernadette BRAVO, NP  Active   empagliflozin (JARDIANCE) 10 MG TABS tablet [id number]  Take 10 mg by mouth daily. [provider]  Active   FARXIGA 10 MG TABS tablet [id number]  Take 10 mg by mouth daily.  Patient not taking: Reported on 09/01/2024   [provider]  Active   furosemide   (LASIX ) 40 MG tablet [id number]  Take 1 tablet (40 mg total) by mouth daily. Patwardhan, Newman PARAS, MD  Active   gabapentin (NEURONTIN) 100 MG capsule [id number]  Take 200 mg by mouth at bedtime. 2 tablets [provider]  Active   Iron, Ferrous Sulfate, 325 (65 Fe) MG TABS [id number]  2 (two) times a week. [provider]  Active   levothyroxine  (SYNTHROID , LEVOTHROID) 75 MCG tablet [id number]  Take 75 mcg by mouth daily. [provider]  Active Family Member           Med Note BEVERLEE, REYES ORN   Sat Apr 01, 2016 12:47 AM)    Multiple Vitamins-Minerals (MULTIVITAMIN GUMMIES ADULT) CICERO [id number]  Chew 2 tablets by mouth daily. [provider]  Active Family Member  OLANZapine  (ZYPREXA ) 2.5 MG tablet [id number]  Take 2.5 mg by mouth at bedtime. [provider]  Active   potassium chloride  SA (KLOR-CON  M) 20 MEQ tablet [id number]  TAKE 2 TABLET BY MOUTH TWICE DAILY.  Patient not taking: Reported on 09/01/2024   [place] Newman PARAS, MD  Active   potassium chloride  SA (KLOR-CON  M) 20 MEQ tablet [id number]  TAKE 2 TABLETS(40 MEQ) BY MOUTH DAILY Patwardhan, Manish J, MD  Active   rosuvastatin (CRESTOR) 5 MG tablet [id number]  Take 10 mg by mouth daily. [provider]  Active   SODIUM FLUORIDE 5000 PPM 1.1 % PSTE [id number]  Take by mouth. [provider]  Active   triamterene -hydrochlorothiazide  (DYAZIDE ) 37.5-25 MG capsule [id number]  TAKE 1 CAPSULE BY MOUTH EVERY DAY Patwardhan, Newman PARAS, MD  Active             Recommendation:   Continue Current Plan of Care  Follow Up Plan:   Telephone follow up appointment date/time:  10/01/24 at 10:30 AM  Rosaline Finlay, RN MSN [place]  [facility] RN Care Manager Direct Dial: [phone]  Fax: [phone]

## 2024-10-01 ENCOUNTER — Other Ambulatory Visit: Payer: Self-pay

## 2024-10-01 NOTE — Patient Outreach (Signed)
 Complex Care Management   Visit Note  10/01/2024  Name:  Julia Huff MRN: [id number] DOB: [birth year]  Situation: Referral received for Complex Care Management related to Heart Failure, Diabetes with Complications, and HTN I obtained verbal consent from Patient.  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 She reports this is occuring every night, but does not keep her up. She reports compliance with Gabapentin, wearing good socks and shoes. Patient plans to discuss further with PCP Neurological Management Strategies: Exercise, Medication therapy, Routine screening  HEENT HEENT Symptoms Reported: Not assessed      Cardiovascular Cardiovascular Symptoms Reported: Swelling in legs or feet (Swelling at baseline per patient) Does patient have uncontrolled Hypertension?: No Cardiovascular Management Strategies: Medication therapy, Routine screening, Exercise, Weight management Cardiovascular Comment: Patient reports she has an upcoming appointment in November with cardiology.  Respiratory Respiratory Symptoms Reported: No symptoms reported Additional Respiratory Details: Patient  reports she did receive her flu vaccine this year.    Endocrine Endocrine Symptoms Reported: No symptoms reported Is patient diabetic?: Yes Is patient checking blood sugars at home?: No    Gastrointestinal Gastrointestinal Symptoms Reported: No symptoms reported Gastrointestinal Comment: Patient reports she still has not scheduled her colonoscopy. She reports she previously saw Dr. Dianna with [facility]. Offered to provide office phone number but she does not have anything to write with. Patient reports she will call back for the number or call PCP to ask for number.    Genitourinary Genitourinary Symptoms Reported: Not assessed    Integumentary Integumentary Symptoms Reported: Not assessed    Musculoskeletal Musculoskelatal Symptoms Reviewed: Unsteady gait, Difficulty walking, Joint pain Additional Musculoskeletal Details: Patient reports she missed call from orthopedic provider in reference to gel shot. She reports she has to return their call. She reports she continues to take Tylenol  Arthritis and use Icy Hot on her knee Musculoskeletal Management Strategies: Medical device, Exercise, Medication therapy (Walker and cane) Musculoskeletal Comment: Patient denies falls since previous CMRN visit. She reports she is going to be starting [facility] PT next week, once a week on Friday. Falls in the past year?: No Number of falls in past year: 1 or less Was there an injury with Fall?: No Fall Risk Category Calculator: 0 Patient Fall Risk Level: Low Fall Risk Patient at Risk for Falls Due to: Impaired balance/gait Fall risk Follow up: Falls evaluation completed, Education provided, Falls prevention discussed  Psychosocial Psychosocial Symptoms Reported: Not assessed          10/01/2024    PHQ2-9 Depression Screening   Little interest or pleasure in doing things    Feeling down, depressed, or hopeless    PHQ-2 - Total Score    Trouble falling  or staying asleep, or sleeping too much    Feeling  tired or having little energy    Poor appetite or overeating     Feeling bad about yourself - or that you are a failure or have let yourself or your family down    Trouble concentrating on things, such as reading the newspaper or watching television    Moving or speaking so slowly that other people could have noticed.  Or the opposite - being so fidgety or restless that you have been moving around a lot more than usual    Thoughts that you would be better off dead, or hurting yourself in some way    PHQ2-9 Total Score    If you checked off any problems, how difficult have these problems made it for you to do your work, take care of things at home, or get along with other people    Depression Interventions/Treatment      There were no vitals filed for this visit.  Medications Reviewed Today     Reviewed by Arno Rosaline SQUIBB, RN (Registered Nurse) on 10/01/24 at 1032  Med List Status: <None>   Medication Order Taking? Sig Documenting Provider Last Dose Status Informant  acetaminophen  (TYLENOL ) 500 MG tablet [id number]  Take 500 mg by mouth every 6 (six) hours as needed for mild pain. [provider]  Active Family Member  ARIPiprazole (ABILIFY) 5 MG tablet [id number]  Take 5 mg by mouth every morning. [provider]  Active   Cholecalciferol (VITAMIN D) 2000 units tablet [id number]  Take 4,000 Units by mouth daily.  [provider]  Active Family Member  cyanocobalamin (VITAMIN B12) 250 MCG tablet [id number]  Take 250 mcg by mouth 2 (two) times a week. [provider]  Active   divalproex  (DEPAKOTE  SPRINKLE) 125 MG capsule [id number]  Take 2 capsules (250 mg total) by mouth 2 (two) times daily. Moishe Bernadette BRAVO, NP  Active   empagliflozin (JARDIANCE) 10 MG TABS tablet [id number]  Take 10 mg by mouth daily. [provider]  Active   FARXIGA 10 MG TABS tablet [id number]  Take 10 mg by mouth daily.  Patient not taking: Reported on 09/01/2024   [provider]  Active   furosemide  (LASIX ) 40 MG tablet [id number]  Take 1 tablet (40 mg total) by mouth daily. Patwardhan, Newman PARAS, MD  Active   gabapentin (NEURONTIN) 100 MG capsule [id number]  Take 200 mg by mouth at bedtime. 2 tablets [provider]  Active   Iron, Ferrous Sulfate, 325 (65 Fe) MG TABS [id number]  2 (two) times a week. [provider]  Active   levothyroxine  (SYNTHROID , LEVOTHROID) 75 MCG tablet [id number]  Take 75 mcg by mouth daily. [provider]  Active Family Member           Med Note BEVERLEE, REYES ORN   Sat Apr 01, 2016 12:47 AM)    Multiple Vitamins-Minerals (MULTIVITAMIN GUMMIES ADULT) CICERO [id number]  Chew 2 tablets by mouth daily. [provider]  Active Family Member  OLANZapine  (ZYPREXA ) 2.5 MG tablet [id number]  Take 2.5 mg by mouth at bedtime. [provider]  Active   potassium chloride  SA (KLOR-CON  M) 20 MEQ tablet [id number]  TAKE 2 TABLET BY MOUTH TWICE DAILY.  Patient not taking: Reported on 09/01/2024   [place]mira Newman PARAS, MD  Active   potassium chloride  SA (KLOR-CON  M) 20 MEQ tablet [id number]  TAKE 2 TABLETS(40 MEQ) BY MOUTH DAILY Patwardhan, Manish J,  MD  Active   rosuvastatin (CRESTOR) 5 MG tablet [id number]  Take 10 mg by mouth daily. [provider]  Active   SODIUM FLUORIDE 5000 PPM 1.1 % PSTE [id number]  Take by mouth. [provider]  Active   triamterene -hydrochlorothiazide  (DYAZIDE ) 37.5-25 MG capsule [id number]  TAKE 1 CAPSULE BY MOUTH EVERY DAY Patwardhan, Newman PARAS, MD  Active             Recommendation:   Continue Current Plan of Care  Follow Up Plan:   Telephone follow up appointment date/time:  10/29/24 at 10:30 AM  Rosaline Finlay, RN MSN [place]  [facility] RN Care Manager Direct Dial: [phone]  Fax: [phone]

## 2024-10-01 NOTE — Patient Instructions (Signed)
 Visit Information  Thank you for taking time to visit with me today. Please don't hesitate to contact me if I can be of assistance to you before our next scheduled appointment.  Your next care management appointment is by telephone on 10/29/24 at 10:30 AM  Please call the care guide team at [phone] if you need to cancel, schedule, or reschedule an appointment.   Please call the Suicide and Crisis Lifeline: 988 call 1-800-273-TALK (toll free, 24 hour hotline) if you are experiencing a Mental Health or Behavioral Health Crisis or need someone to talk to.  Rosaline Finlay, RN MSN [place]  [facility] RN Care Manager Direct Dial: [phone]  Fax: [phone]

## 2024-10-13 DIAGNOSIS — F251 Schizoaffective disorder, depressive type: Secondary | ICD-10-CM | POA: Diagnosis not present

## 2024-10-29 ENCOUNTER — Other Ambulatory Visit: Payer: Self-pay

## 2024-10-29 NOTE — Patient Instructions (Signed)
 Visit Information  Thank you for taking time to visit with me today. Please don't hesitate to contact me if I can be of assistance to you before our next scheduled appointment.  Your next care management appointment is by telephone on 11/26/24 at 10:30 AM  Please call the care guide team at [phone] if you need to cancel, schedule, or reschedule an appointment.   Please call the Suicide and Crisis Lifeline: 988 call 1-800-273-TALK (toll free, 24 hour hotline) if you are experiencing a Mental Health or Behavioral Health Crisis or need someone to talk to.  Rosaline Finlay, RN MSN [place]  [facility] RN Care Manager Direct Dial: [phone]  Fax: [phone]

## 2024-10-29 NOTE — Patient Outreach (Signed)
 Complex Care Management   Visit Note  10/29/2024  Name:  Julia Huff MRN: [id number] DOB: [birth year]  Situation: Referral received for Complex Care Management related to Heart Failure, Diabetes with Complications, and HTN I obtained verbal consent from Patient.  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    Assessment: Patient Reported Symptoms:  Cognitive Cognitive Status: Able to follow simple commands, Alert and oriented to person, place, and time, Normal speech and language skills Cognitive/Intellectual Conditions Management [RPT]: None reported or documented in medical history or problem list   Health Maintenance Behaviors: Annual physical exam, Exercise Health Facilitated by: Rest, Pain control  Neurological Neurological Review of Symptoms: No symptoms reported Neurological Management Strategies: Exercise, Medication therapy, Routine screening Neurological Comment: Patient reports pain in her feet has decreased since visit, it comes and goes still occurring at nighttime when it does come  HEENT HEENT Symptoms Reported: No symptoms reported HEENT Management Strategies: Routine screening, Medical device (Eyelgasses)    Cardiovascular Cardiovascular Symptoms Reported: No symptoms reported Does patient have uncontrolled Hypertension?: No Cardiovascular Management Strategies: Medication therapy, Routine screening, Exercise, Weight management Do You Have a Working Readable Scale?: Yes Weight: 189 lb (85.7 kg) (Patient reported)  Respiratory Respiratory Symptoms Reported: No symptoms reported    Endocrine  Endocrine Symptoms Reported: No symptoms reported Is patient diabetic?: Yes Is patient checking blood sugars at home?: No    Gastrointestinal Gastrointestinal Symptoms Reported: No symptoms reported Additional Gastrointestinal Details: Patient reports intentional weight loss. Discussed goal weight (patient wants to lose about 40 more lbs) Gastrointestinal Management Strategies: Exercise, Diet modification Gastrointestinal Comment: Patient reports she is waiting until after the first of the year to schedule colonoscopy    Genitourinary Genitourinary Symptoms Reported: Frequency, Urgency Additional Genitourinary Details: Frequency and urgency related to diuretic Genitourinary Management Strategies: Medication therapy, Incontinence garment/pad  Integumentary Integumentary Symptoms Reported: No symptoms reported    Musculoskeletal Musculoskelatal Symptoms Reviewed: Difficulty walking, Joint pain, Unsteady gait Additional Musculoskeletal Details: Paitent feels that pain has improved with exercise. She has not gotten gel shot, but does continue to use Tylenol  Arthritis and Icy Hot Musculoskeletal Management Strategies: Medical device, Exercise, Medication therapy (Walker and cane) Musculoskeletal Comment: Patient reports HH PT has started twice a week and is going well. Patient denies falls since previous CMRN visit Falls in the past year?: No Number of falls in past year: 1 or less Was there an injury with Fall?: No Fall Risk Category Calculator: 0 Patient Fall Risk Level: Low Fall Risk Patient at Risk for Falls Due to: Impaired balance/gait Fall risk Follow up: Falls evaluation completed, Education provided, Falls prevention discussed  Psychosocial Psychosocial Symptoms Reported: No symptoms reported          10/29/2024    PHQ2-9 Depression Screening   Little interest or pleasure in doing things Not at all  Feeling down, depressed, or hopeless Not at all  PHQ-2 - Total Score 0  Trouble  falling or staying asleep, or sleeping too much    Feeling tired or having little energy    Poor appetite or overeating  Feeling bad about yourself - or that you are a failure or have let yourself or your family down    Trouble concentrating on things, such as reading the newspaper or watching television    Moving or speaking so slowly that other people could have noticed.  Or the opposite - being so fidgety or restless that you have been moving around a lot more than usual    Thoughts that you would be better off dead, or hurting yourself in some way    PHQ2-9 Total Score    If you checked off any problems, how difficult have these problems made it for you to do your work, take care of things at home, or get along with other people    Depression Interventions/Treatment      There were no vitals filed for this visit.  Medications Reviewed Today     Reviewed by Arno Rosaline SQUIBB, RN (Registered Nurse) on 10/29/24 at 1043  Med List Status: <None>   Medication Order Taking? Sig Documenting Provider Last Dose Status Informant  acetaminophen  (TYLENOL ) 500 MG tablet [id number]  Take 500 mg by mouth every 6 (six) hours as needed for mild pain. [provider]  Active Family Member  ARIPiprazole (ABILIFY) 5 MG tablet [id number] Yes Take 5 mg by mouth every morning. [provider]  Active   Cholecalciferol (VITAMIN D) 2000 units tablet [id number]  Take 4,000 Units by mouth daily.  [provider]  Active Family Member  cyanocobalamin (VITAMIN B12) 250 MCG tablet [id number]  Take 250 mcg by mouth 2 (two) times a week. [provider]  Active   divalproex  (DEPAKOTE  SPRINKLE) 125 MG capsule [id number] Yes Take 2 capsules (250 mg total) by mouth 2 (two) times daily. Moishe Bernadette BRAVO, NP  Active   empagliflozin (JARDIANCE) 10 MG TABS tablet [id number]  Take 10 mg by mouth daily. [provider]  Active   FARXIGA 10 MG TABS tablet [id number]  Take 10 mg by mouth  daily.  Patient not taking: Reported on 09/01/2024   [provider]  Active   furosemide  (LASIX ) 40 MG tablet [id number]  Take 1 tablet (40 mg total) by mouth daily. Patwardhan, Newman PARAS, MD  Active   gabapentin (NEURONTIN) 100 MG capsule [id number]  Take 200 mg by mouth at bedtime. 2 tablets [provider]  Active   Iron, Ferrous Sulfate, 325 (65 Fe) MG TABS [id number]  2 (two) times a week. [provider]  Active   levothyroxine  (SYNTHROID , LEVOTHROID) 75 MCG tablet [id number]  Take 75 mcg by mouth daily. [provider]  Active Family Member           Med Note BEVERLEE, REYES ORN   Sat Apr 01, 2016 12:47 AM)    Multiple Vitamins-Minerals (MULTIVITAMIN GUMMIES ADULT) CICERO [id number]  Chew 2 tablets by mouth daily. [provider]  Active Family Member  OLANZapine  (ZYPREXA ) 2.5 MG tablet [id number] Yes Take 2.5 mg by mouth at bedtime. [provider]  Active   potassium chloride  SA (KLOR-CON  M) 20 MEQ tablet [id number]  TAKE 2 TABLET BY MOUTH TWICE DAILY.  Patient not taking: Reported on 09/01/2024   [place] Newman PARAS, MD  Active   potassium chloride  SA (KLOR-CON  M) 20 MEQ tablet [id number]  TAKE 2 TABLETS(40 MEQ) BY MOUTH DAILY Patwardhan, Manish J, MD  Active   rosuvastatin (CRESTOR) 5 MG tablet [id number]  Take 10 mg by mouth daily. [provider]  Active   SODIUM FLUORIDE 5000  PPM 1.1 % PSTE [id number]  Take by mouth. [provider]  Active   triamterene -hydrochlorothiazide  (DYAZIDE ) 37.5-25 MG capsule [id number]  TAKE 1 CAPSULE BY MOUTH EVERY DAY Patwardhan, Newman PARAS, MD  Active             Recommendation:   PCP Follow-up Continue Current Plan of Care  Follow Up Plan:   Telephone follow up appointment date/time:  11/26/24 at 10:30 AM  Rosaline Finlay, RN MSN [place]  [facility] RN Care Manager Direct Dial: [phone]  Fax: [phone]

## 2024-11-03 ENCOUNTER — Other Ambulatory Visit: Payer: Self-pay | Admitting: Cardiology

## 2024-11-03 DIAGNOSIS — R600 Localized edema: Secondary | ICD-10-CM

## 2024-11-03 DIAGNOSIS — I5032 Chronic diastolic (congestive) heart failure: Secondary | ICD-10-CM

## 2024-11-05 MED ORDER — FUROSEMIDE 40 MG PO TABS: 40.0000 mg | ORAL_TABLET | Freq: Every day | ORAL | 0 refills | Status: AC

## 2024-11-14 ENCOUNTER — Other Ambulatory Visit: Payer: Self-pay | Admitting: Cardiology

## 2024-11-26 ENCOUNTER — Other Ambulatory Visit: Payer: Self-pay

## 2024-11-26 NOTE — Patient Instructions (Signed)
 Visit Information  Thank you for taking time to visit with me today. Please don't hesitate to contact me if I can be of assistance to you before our next scheduled appointment.  Your next care management appointment is by telephone on 12/24/24 at 10:30 AM  Please call the care guide team at [phone] if you need to cancel, schedule, or reschedule an appointment.   Please call the Suicide and Crisis Lifeline: 988 call 1-800-273-TALK (toll free, 24 hour hotline) if you are experiencing a Mental Health or Behavioral Health Crisis or need someone to talk to.  Rosaline Finlay, RN MSN [place]  [facility] RN Care Manager Direct Dial: [phone]  Fax: [phone]

## 2024-11-26 NOTE — Patient Outreach (Signed)
 Complex Care Management   Visit Note  11/26/2024  Name:  Julia Huff MRN: [id number] DOB: 1956/04/08  Situation: Referral received for Complex Care Management related to Heart Failure and HTN I obtained verbal consent from Patient.  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    Assessment: Patient Reported Symptoms:  Cognitive Cognitive Status: Able to follow simple commands, Alert and oriented to person, place, and time, Normal speech and language skills Cognitive/Intellectual Conditions Management [RPT]: None reported or documented in medical history or problem list   Health Maintenance Behaviors: Annual physical exam, Exercise Health Facilitated by: Pain control, Rest, Healthy diet  Neurological Neurological Review of Symptoms: Not assessed    HEENT HEENT Symptoms Reported: No symptoms reported HEENT Management Strategies: Medical device, Routine screening (Eyeglasses) HEENT Comment: Patient reports upcoming dental appointment 11/27/24 and eye doctor 12/01/24    Cardiovascular Cardiovascular Symptoms Reported: No symptoms reported Does patient have uncontrolled Hypertension?: No Cardiovascular Management Strategies: Medication therapy, Routine screening, Exercise, Weight management Do You Have a Working Readable Scale?: Yes Cardiovascular Comment: Note per chart review patient has not had cardiology visit in about a year. Provided with office phone number and advised to call and schedule appointment. Patient has not been weighing daily due to fear of balancing on scale/falling. She does not feel like she has gained  or lost weight  Respiratory Respiratory Symptoms Reported: No symptoms reported Additional Respiratory Details: Note open care gape for vaccinations. Patient reports she gets all vaccinations through [facility] Respiratory Management Strategies: Routine screening  Endocrine Endocrine Symptoms Reported: No symptoms reported Is patient diabetic?: Yes Is patient checking blood sugars at home?: No    Gastrointestinal Gastrointestinal Symptoms Reported: No symptoms reported Additional Gastrointestinal Details: Patient continues to report good appetite and regular BMs Gastrointestinal Management Strategies: Diet modification, Exercise Gastrointestinal Comment: Patient reports she is still planning to wait until the first of the year to schedule colonoscopy    Genitourinary Genitourinary Symptoms Reported: Not assessed    Integumentary Integumentary Symptoms Reported: Not assessed    Musculoskeletal Musculoskelatal Symptoms Reviewed: Joint pain, Unsteady gait Musculoskeletal Management Strategies: Medical device, Exercise, Medication therapy (Walker and cane) Musculoskeletal Comment: Patient reports she continues to work with [facility] PT twice a week. She is unsure how much longer they will be working together. Patient denies falls since previous CMRN visit. Patient reports the goal is to be driving by Christmas Falls in the past year?: No Number of falls in past year: 1 or less Was there an injury with Fall?: No Fall Risk Category Calculator: 0 Patient Fall Risk Level: Low Fall Risk Patient at Risk for Falls Due to: Impaired balance/gait Fall risk Follow up: Falls evaluation completed, Education provided, Falls prevention discussed  Psychosocial Psychosocial Symptoms Reported: Not assessed          11/26/2024    PHQ2-9 Depression Screening   Little interest or pleasure in doing things    Feeling down, depressed, or hopeless    PHQ-2 - Total Score    Trouble falling or staying asleep, or  sleeping too much  Feeling tired or having little energy    Poor appetite or overeating     Feeling bad about yourself - or that you are a failure or have let yourself or your family down    Trouble concentrating on things, such as reading the newspaper or watching television    Moving or speaking so slowly that other people could have noticed.  Or the opposite - being so fidgety or restless that you have been moving around a lot more than usual    Thoughts that you would be better off dead, or hurting yourself in some way    PHQ2-9 Total Score    If you checked off any problems, how difficult have these problems made it for you to do your work, take care of things at home, or get along with other people    Depression Interventions/Treatment      Today's Vitals   Pain Scale: 0-10 Pain Score: 0-No pain  Medications Reviewed Today     Reviewed by Arno Rosaline SQUIBB, RN (Registered Nurse) on 11/26/24 at 1042  Med List Status: <None>   Medication Order Taking? Sig Documenting Provider Last Dose Status Informant  acetaminophen  (TYLENOL ) 500 MG tablet [id number]  Take 500 mg by mouth every 6 (six) hours as needed for mild pain. [provider]  Active Family Member  ARIPiprazole (ABILIFY) 5 MG tablet [id number]  Take 5 mg by mouth every morning. [provider]  Active   Cholecalciferol (VITAMIN D) 2000 units tablet [id number]  Take 4,000 Units by mouth daily.  [provider]  Active Family Member  cyanocobalamin (VITAMIN B12) 250 MCG tablet [id number]  Take 250 mcg by mouth 2 (two) times a week. [provider]  Active   divalproex  (DEPAKOTE  SPRINKLE) 125 MG capsule [id number]  Take 2 capsules (250 mg total) by mouth 2 (two) times daily. Moishe Bernadette BRAVO, NP  Active   empagliflozin (JARDIANCE) 10 MG TABS tablet [id number]  Take 10 mg by mouth daily. [provider]  Active   FARXIGA 10 MG TABS tablet [id number]  Take 10 mg by mouth daily.  Patient not  taking: Reported on 10/29/2024   [provider]  Active   furosemide  (LASIX ) 40 MG tablet [id number]  Take 1 tablet (40 mg total) by mouth daily. Patwardhan, Newman PARAS, MD  Active   gabapentin (NEURONTIN) 100 MG capsule [id number]  Take 200 mg by mouth at bedtime. 2 tablets [provider]  Active   Iron, Ferrous Sulfate, 325 (65 Fe) MG TABS [id number]  2 (two) times a week. [provider]  Active   levothyroxine  (SYNTHROID , LEVOTHROID) 75 MCG tablet [id number]  Take 75 mcg by mouth daily. [provider]  Active Family Member           Med Note BEVERLEE, REYES ORN   Sat Apr 01, 2016 12:47 AM)    Multiple Vitamins-Minerals (MULTIVITAMIN GUMMIES ADULT) CICERO [id number]  Chew 2 tablets by mouth daily. [provider]  Active Family Member  OLANZapine  (ZYPREXA ) 2.5 MG tablet [id number]  Take 2.5 mg by mouth at bedtime. [provider]  Active   potassium chloride  SA (KLOR-CON  M) 20 MEQ tablet [id number]  TAKE 2 TABLET BY MOUTH TWICE DAILY.  Patient not taking: Reported on 09/01/2024   [place] Newman PARAS, MD  Active   potassium chloride  SA (KLOR-CON  M) 20 MEQ tablet [id number]  TAKE 2 TABLETS(40 MEQ) BY MOUTH DAILY Patwardhan, Manish J, MD  Active   rosuvastatin (CRESTOR) 5  MG tablet [id number]  Take 10 mg by mouth daily. [provider]  Active   SODIUM FLUORIDE 5000 PPM 1.1 % PSTE [id number]  Take by mouth. [provider]  Active   triamterene -hydrochlorothiazide  (DYAZIDE ) 37.5-25 MG capsule [id number]  TAKE 1 CAPSULE BY MOUTH EVERY DAY Patwardhan, Newman PARAS, MD  Active             Recommendation:   Continue Current Plan of Care  Follow Up Plan:   Telephone follow up appointment date/time:  12/24/24 at 10:30 AM  Rosaline Finlay, RN MSN [place]  [facility] RN Care Manager Direct Dial: [phone]  Fax: [phone]

## 2024-12-23 ENCOUNTER — Other Ambulatory Visit: Payer: Self-pay | Admitting: Cardiology

## 2024-12-24 ENCOUNTER — Other Ambulatory Visit: Payer: Self-pay

## 2024-12-24 NOTE — Patient Instructions (Signed)
 Visit Information  Thank you for taking time to visit with me today. Please don't hesitate to contact me if I can be of assistance to you before our next scheduled appointment.  Your next care management appointment is by telephone on 01/30/25 at 10:30 AM  Please call the care guide team at [phone] if you need to cancel, schedule, or reschedule an appointment.   Please call the Suicide and Crisis Lifeline: 988 call 1-800-273-TALK (toll free, 24 hour hotline) if you are experiencing a Mental Health or Behavioral Health Crisis or need someone to talk to.  Rosaline Finlay, RN MSN [place]  [facility] RN Care Manager Direct Dial: [phone]  Fax: [phone]

## 2024-12-24 NOTE — Patient Outreach (Signed)
 Complex Care Management   Visit Note  12/24/2024  Name:  Julia Huff MRN: [id number] DOB: June 20, 1956  Situation: Referral received for Complex Care Management related to Heart Failure I obtained verbal consent from Patient.  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    Assessment: Patient Reported Symptoms:  Cognitive Cognitive Status: Able to follow simple commands, Alert and oriented to person, place, and time, Normal speech and language skills Cognitive/Intellectual Conditions Management [RPT]: None reported or documented in medical history or problem list   Health Maintenance Behaviors: Annual physical exam, Exercise Health Facilitated by: Pain control, Rest, Healthy diet  Neurological Neurological Review of Symptoms: Not assessed    HEENT HEENT Symptoms Reported: No symptoms reported HEENT Management Strategies: Medical device, Routine screening HEENT Comment: Patient reports she recently received new eyeglasses    Cardiovascular Cardiovascular Symptoms Reported: No symptoms reported Does patient have uncontrolled Hypertension?: No Cardiovascular Management Strategies: Medication therapy, Routine screening, Exercise, Weight management Do You Have a Working Readable Scale?: Yes Cardiovascular Comment: Patient reports she has scheduled follow-up with cardiology 01/29/25  Respiratory Respiratory Symptoms Reported: Not assesed    Endocrine Endocrine Symptoms Reported: Not assessed    Gastrointestinal Gastrointestinal Symptoms Reported: No symptoms reported Gastrointestinal Comment: Patient reports she will  start calling to schedule a colonoscopy.    Genitourinary Genitourinary Symptoms Reported: Not assessed    Integumentary Integumentary Symptoms Reported: Not assessed    Musculoskeletal Musculoskelatal Symptoms Reviewed: Joint pain, Unsteady gait Musculoskeletal Management Strategies: Medical device, Exercise, Medication therapy (Walker and cane) Musculoskeletal Comment: Patient reports HH PT continues to come twice a week. Patient reports she is going to start going to the gym at a friend's apartment to use the treadmill and weights. Patient reports she did not reach her goal of driving by Christmas, and has now adjusted to by Easter. Patient denies falls since previous CMRN visit. Falls in the past year?: No Number of falls in past year: 1 or less Was there an injury with Fall?: No Fall Risk Category Calculator: 0 Patient Fall Risk Level: Low Fall Risk Patient at Risk for Falls Due to: Impaired balance/gait Fall risk Follow up: Falls evaluation completed, Education provided, Falls prevention discussed  Psychosocial Psychosocial Symptoms Reported: Not assessed          12/24/2024    PHQ2-9 Depression Screening   Little interest or pleasure in doing things    Feeling down, depressed, or hopeless    PHQ-2 - Total Score    Trouble falling or staying asleep, or sleeping too much    Feeling tired or having little energy    Poor appetite or overeating     Feeling bad about yourself - or that you are a failure or have let yourself or your family down    Trouble concentrating on things, such as reading the newspaper or watching television    Moving or speaking so slowly that other people could have noticed.  Or the opposite - being so fidgety or restless that you have been moving around a lot more than usual    Thoughts  that you would be better off dead, or hurting yourself in some way    PHQ2-9 Total Score    If you checked off any problems, how difficult have these problems made it for  you to do your work, take care of things at home, or get along with other people    Depression Interventions/Treatment      There were no vitals filed for this visit. Pain Scale: 0-10 Pain Score: 0-No pain  Medications Reviewed Today     Reviewed by Julia Rosaline SQUIBB, RN (Registered Nurse) on 12/24/24 at 1035  Med List Status: <None>   Medication Order Taking? Sig Documenting Provider Last Dose Status Informant  acetaminophen  (TYLENOL ) 500 MG tablet [id number]  Take 500 mg by mouth every 6 (six) hours as needed for mild pain. [provider]  Active Family Member  ARIPiprazole (ABILIFY) 5 MG tablet [id number]  Take 5 mg by mouth every morning. [provider]  Active   Cholecalciferol (VITAMIN D) 2000 units tablet [id number]  Take 4,000 Units by mouth daily.  [provider]  Active Family Member  cyanocobalamin (VITAMIN B12) 250 MCG tablet [id number]  Take 250 mcg by mouth 2 (two) times a week. [provider]  Active   divalproex  (DEPAKOTE  SPRINKLE) 125 MG capsule [id number]  Take 2 capsules (250 mg total) by mouth 2 (two) times daily. Julia Bernadette BRAVO, NP  Active   empagliflozin (JARDIANCE) 10 MG TABS tablet [id number]  Take 10 mg by mouth daily. [provider]  Active   FARXIGA 10 MG TABS tablet [id number]  Take 10 mg by mouth daily.  Patient not taking: Reported on 10/29/2024   [provider]  Active   furosemide  (LASIX ) 40 MG tablet [id number]  Take 1 tablet (40 mg total) by mouth daily. Huff, Julia PARAS, MD  Active   gabapentin (NEURONTIN) 100 MG capsule [id number]  Take 200 mg by mouth at bedtime. 2 tablets [provider]  Active   Iron, Ferrous Sulfate, 325 (65 Fe) MG TABS [id number]  2 (two) times a week. [provider]  Active   levothyroxine  (SYNTHROID , LEVOTHROID) 75 MCG tablet [id number]  Take 75 mcg by mouth daily. [provider]  Active Family Member           Med Note Julia Huff   Sat Apr 01, 2016 12:47 AM)    Multiple Vitamins-Minerals (MULTIVITAMIN GUMMIES ADULT) Julia Huff  Chew 2 tablets by mouth daily. [provider]  Active Family Member  OLANZapine  (ZYPREXA ) 2.5 MG tablet [id number]  Take 2.5 mg by mouth at bedtime. [provider]  Active   potassium chloride  SA (KLOR-CON  M) 20 MEQ tablet [id number]  TAKE 2 TABLET BY MOUTH TWICE DAILY.  Patient not taking: Reported on 09/01/2024   [place] Julia PARAS, MD  Active   potassium chloride  SA (KLOR-CON  M) 20 MEQ tablet [id number]  TAKE 2 TABLETS(40 MEQ) BY MOUTH DAILY Huff, Manish J, MD  Active   rosuvastatin (CRESTOR) 5 MG tablet [id number]  Take 10 mg by mouth daily. [provider]  Active   SODIUM FLUORIDE 5000 PPM 1.1 % PSTE [id number]  Take by mouth. [provider]  Active   triamterene -hydrochlorothiazide  (DYAZIDE ) 37.5-25 MG capsule [id number]  TAKE 1 CAPSULE BY MOUTH EVERY DAY Huff, Manish J, MD  Active             Recommendation:   Continue Current Plan of Care Patient will schedule colonoscopy  Follow Up Plan:  Telephone follow up appointment date/time:  01/30/25 at 10:30 AM  Rosaline Finlay, RN MSN [place]  [facility] RN Care Manager Direct Dial: [phone]  Fax: [phone]

## 2025-01-19 ENCOUNTER — Other Ambulatory Visit: Payer: Self-pay | Admitting: Cardiology

## 2025-01-22 ENCOUNTER — Other Ambulatory Visit: Payer: Self-pay | Admitting: Cardiology

## 2025-01-29 ENCOUNTER — Ambulatory Visit: Admitting: Cardiology

## 2025-01-30 ENCOUNTER — Other Ambulatory Visit: Payer: Self-pay | Admitting: Cardiology

## 2025-01-30 ENCOUNTER — Other Ambulatory Visit: Payer: Self-pay

## 2025-01-30 DIAGNOSIS — I5032 Chronic diastolic (congestive) heart failure: Secondary | ICD-10-CM

## 2025-01-30 DIAGNOSIS — R600 Localized edema: Secondary | ICD-10-CM

## 2025-01-30 NOTE — Patient Instructions (Signed)
 Visit Information  Thank you for taking time to visit with me today. Please don't hesitate to contact me if I can be of assistance to you before our next scheduled appointment.  Your next care management appointment is by telephone on 02/27/25 at 10:30 AM  Please call the care guide team at [phone] if you need to cancel, schedule, or reschedule an appointment.   Please call the Suicide and Crisis Lifeline: 988 call 1-800-273-TALK (toll free, 24 hour hotline) if you are experiencing a Mental Health or Behavioral Health Crisis or need someone to talk to.  Julia Finlay, RN MSN [place]  [facility] RN Care Manager Direct Dial: [phone]  Fax: [phone]  Following is a copy of your care plan:   Goals Addressed             This Visit's Progress    [facility] RN Care Plan   On track    Problems:  Chronic Disease Management support and education needs related to CHF and HTN  Goal: Over the next 30 days the Patient will demonstrate Ongoing health management independence as evidenced by incorporating regular exercise into her routine and following a healthy diet as recommended by provider        verbalize understanding of plan for management of CHF as evidenced by patient report of monitoring for worsening swelling and verbalizing the rationale for checking weight as well as signs of fluid retention Schedule colonoscopy as evidenced by patient report or chart review  Interventions:   Heart Failure Interventions: Provided education on low sodium diet Reviewed role of diuretics in prevention of fluid overload and management of heart failure; Discussed the importance of keeping all appointments with provider Provided patient with education about the role of exercise in the management of heart failure Reviewed importance of monitoring for new or worsening swelling in the legs, feet, and abdomen   Hypertension Interventions: Last practice recorded BP readings:   BP Readings from Last 3 Encounters:  11/19/23 122/76  10/09/23 136/74  08/25/22 139/79   Most recent eGFR/CrCl:  Lab Results  Component Value Date   EGFR 65 08/21/2022    No components found for: CRCL  Evaluation of current treatment plan related to hypertension self management and patient's adherence to plan as established by provider Reviewed medications with patient and discussed importance of compliance Counseled on the importance of exercise goals with target of 150 minutes per week Advised patient, providing education and rationale, to monitor blood pressure daily and record, calling PCP for findings outside established parameters  General Interventions Reviewed care gap for colonoscopy. Patient reports she is waiting until the weather clears up and will begin calling to schedule  Patient Self-Care Activities:  Attend all scheduled provider appointments Call provider office for new concerns or questions  Perform all self care activities independently  Perform IADL's (shopping, preparing meals, housekeeping, managing finances) independently Take medications as prescribed   use salt in moderation watch for swelling in feet, ankles and legs every day follow rescue plan if symptoms flare-up check blood pressure weekly write blood pressure results in a log or diary take blood pressure log to all doctor appointments call doctor for signs and symptoms of high blood pressure take medications for blood pressure exactly as prescribed  Plan:  Telephone follow up appointment with care management team member scheduled for:  02/27/25 at 10:30 AM             Patient verbalized understanding of Care plan and visit  instructions communicated this visit

## 2025-01-30 NOTE — Patient Outreach (Signed)
 Complex Care Management   Visit Note  01/30/2025  Name:  Julia Huff MRN: [id number] DOB: 11-18-1956  Situation: Referral received for Complex Care Management related to Heart Failure I obtained verbal consent from Patient.  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    Assessment: Patient Reported Symptoms:  Cognitive Cognitive Status: Able to follow simple commands, Alert and oriented to person, place, and time, Normal speech and language skills Cognitive/Intellectual Conditions Management [RPT]: None reported or documented in medical history or problem list   Health Maintenance Behaviors: Annual physical exam, Exercise Health Facilitated by: Healthy diet, Pain control, Rest  Neurological Neurological Review of Symptoms: No symptoms reported Neurological Management Strategies: Exercise, Medication therapy, Routine screening  HEENT HEENT Symptoms Reported: Not assessed      Cardiovascular Cardiovascular Symptoms Reported: No symptoms reported Does patient have uncontrolled Hypertension?: No Cardiovascular Management Strategies: Exercise, Medication therapy, Routine screening, Weight management Do You Have a Working Readable Scale?: Yes Weight: 196 lb (88.9 kg) (Patient reported) Cardiovascular Comment: Patient reports she had to reschedule cardiology follow-up and is now scheduled in April  Respiratory Respiratory Symptoms Reported: No symptoms reported Respiratory Management Strategies: Routine screening  Endocrine Endocrine Symptoms Reported: No symptoms reported Is patient diabetic?: Yes Is patient checking  blood sugars at home?: No    Gastrointestinal Gastrointestinal Symptoms Reported: No symptoms reported Gastrointestinal Comment: Patient reports she is waiting until the weather clears up to schedule colonoscopy    Genitourinary Genitourinary Symptoms Reported: Frequency, Urgency Additional Genitourinary Details: Frequency and urgency related to diuretic Genitourinary Management Strategies: Medication therapy, Incontinence garment/pad  Integumentary Integumentary Symptoms Reported: No symptoms reported    Musculoskeletal Musculoskelatal Symptoms Reviewed: Joint pain, Unsteady gait Additional Musculoskeletal Details: Note per chart review patient has had recent PCP visit for right foot pain. Patient reports she has been following PCP recommendations (limit prolonged weight bearing, ice, Tylenol ) and it has been helping Musculoskeletal Management Strategies: Exercise, Medical device, Medication therapy (Walker and cane) Musculoskeletal Comment: Patient reports she continues to work with [facility] and has started to go to the gym. Patient denies falls since previous [facility] visit Falls in the past year?: No Number of falls in past year: 1 or less Was there an injury with Fall?: No Fall Risk Category Calculator: 0 Patient Fall Risk Level: Low Fall Risk Patient at Risk for Falls Due to: Impaired balance/gait Fall risk Follow up: Falls evaluation completed, Education provided, Falls prevention discussed  Psychosocial Psychosocial Symptoms Reported: No symptoms reported Behavioral Management Strategies: Medication therapy, Support group Techniques to Cope with Loss/Stress/Change: Medication, Support group Quality of Family Relationships: helpful, involved, supportive Do you feel physically threatened by others?: No    01/30/2025    PHQ2-9 Depression Screening   Little interest or pleasure in doing things    Feeling down, depressed, or hopeless    PHQ-2 - Total Score    Trouble falling or staying  asleep, or sleeping too much    Feeling tired or having little energy    Poor appetite or overeating     Feeling bad about yourself -  or that you are a failure or have let yourself or your family down    Trouble concentrating on things, such as reading the newspaper or watching television    Moving or speaking so slowly that other people could have noticed.  Or the opposite - being so fidgety or restless that you have been moving around a lot more than usual    Thoughts that you would be better off dead, or hurting yourself in some way    PHQ2-9 Total Score    If you checked off any problems, how difficult have these problems made it for you to do your work, take care of things at home, or get along with other people    Depression Interventions/Treatment      Today's Vitals   01/30/25 1035  Weight: 196 lb (88.9 kg)   Pain Scale: 0-10 Pain Score: 2  Pain Type: Chronic pain Pain Location: Foot Pain Orientation: Right  Medications Reviewed Today     Reviewed by Arno Rosaline SQUIBB, RN (Registered Nurse) on 01/30/25 at 1032  Med List Status: <None>   Medication Order Taking? Sig Documenting Provider Last Dose Status Informant  acetaminophen  (TYLENOL ) 500 MG tablet [id number]  Take 500 mg by mouth every 6 (six) hours as needed for mild pain. [provider]  Active Family Member  ARIPiprazole (ABILIFY) 5 MG tablet [id number]  Take 5 mg by mouth every morning. [provider]  Active   Cholecalciferol (VITAMIN D) 2000 units tablet [id number]  Take 4,000 Units by mouth daily.  [provider]  Active Family Member  cyanocobalamin (VITAMIN B12) 250 MCG tablet [id number]  Take 250 mcg by mouth 2 (two) times a week. [provider]  Active   divalproex  (DEPAKOTE  SPRINKLE) 125 MG capsule [id number]  Take 2 capsules (250 mg total) by mouth 2 (two) times daily. Moishe Bernadette BRAVO, NP  Active   empagliflozin (JARDIANCE) 10 MG TABS tablet [id number]  Take 10 mg by mouth  daily. [provider]  Active   FARXIGA 10 MG TABS tablet [id number]  Take 10 mg by mouth daily.  Patient not taking: Reported on 10/29/2024   [provider]  Active   furosemide  (LASIX ) 40 MG tablet [id number]  Take 1 tablet (40 mg total) by mouth daily. Patwardhan, Newman PARAS, MD  Active   gabapentin (NEURONTIN) 100 MG capsule [id number]  Take 200 mg by mouth at bedtime. 2 tablets [provider]  Active   Iron, Ferrous Sulfate, 325 (65 Fe) MG TABS [id number]  2 (two) times a week. [provider]  Active   levothyroxine  (SYNTHROID , LEVOTHROID) 75 MCG tablet [id number]  Take 75 mcg by mouth daily. [provider]  Active Family Member           Med Note BEVERLEE, REYES ORN   Sat Apr 01, 2016 12:47 AM)    Multiple Vitamins-Minerals (MULTIVITAMIN GUMMIES ADULT) CICERO [id number]  Chew 2 tablets by mouth daily. [provider]  Active Family Member  OLANZapine  (ZYPREXA ) 2.5 MG tablet [id number]  Take 2.5 mg by mouth at bedtime. [provider]  Active   Potassium Chloride  ER 20 MEQ TBCR [id number]  Take 2 tablets (40 mEq total) by mouth daily. Patwardhan, Newman PARAS, MD  Active   potassium chloride  SA (KLOR-CON  M) 20 MEQ tablet [id number]  TAKE 2 TABLET BY MOUTH TWICE DAILY.  Patient not taking: Reported on 09/01/2024   [place] Newman PARAS, MD  Active   rosuvastatin (CRESTOR) 5 MG tablet  [id number]  Take 10 mg by mouth daily. [provider]  Active   SODIUM FLUORIDE 5000 PPM 1.1 % PSTE [id number]  Take by mouth. [provider]  Active   triamterene -hydrochlorothiazide  (DYAZIDE ) 37.5-25 MG capsule [id number]  TAKE 1 CAPSULE BY MOUTH EVERY DAY Patwardhan, Newman PARAS, MD  Active             Recommendation:   Continue Current Plan of Care  Follow Up Plan:   Telephone follow up appointment date/time:  01/30/25 at 10:30 AM  Rosaline Finlay, RN MSN Kay  [facility] RN Care Manager Direct Dial: [phone]   Fax: [phone]

## 2025-02-27 ENCOUNTER — Telehealth

## 2025-04-14 ENCOUNTER — Ambulatory Visit: Admitting: Cardiology

## 2025-04-15 ENCOUNTER — Ambulatory Visit: Admitting: Cardiology
